# Patient Record
Sex: Male | Born: 1980 | Race: Black or African American | Hispanic: No | Marital: Single
Health system: Northeastern US, Academic
[De-identification: ages and names within clinical notes are randomized; demographics above are authoritative.]

## PROBLEM LIST (undated history)

## (undated) DIAGNOSIS — B20 Human immunodeficiency virus [HIV] disease: Secondary | ICD-10-CM

## (undated) DIAGNOSIS — Z21 Asymptomatic human immunodeficiency virus [HIV] infection status: Secondary | ICD-10-CM

## (undated) HISTORY — PX: HERNIA REPAIR: SHX51

## (undated) MED FILL — DOXYCYCLINE HYCLATE 100 MG TABLET: 100 100 mg | ORAL | 5 days supply | Qty: 10 | Fill #0 | Status: CN

---

## 2015-06-18 ENCOUNTER — Ambulatory Visit

## 2015-06-23 ENCOUNTER — Ambulatory Visit

## 2015-06-23 NOTE — Telephone Encounter (Signed)
PHONE NOTE     CALL INFORMATION:     Person Calling: Tarvares p  Relationship to pt: Pt  Day Phone #: [phone]       CALL DETAILS:   pt seeking blood work results   Call Taken by: ......................................Marland KitchenNancy Nordmann  June 23, 2015 2:34 PM         RESPONSE/ORDERS:  2nd seeking blood test results. States he is concerned due to mother being in ICU in house and wants to make sure everything is alright with him ......................................Marland KitchenRanae Palms  June 23, 2015 3:39 PM    called discussed results  ......................................Marland KitchenNeale Burly, MD  June 23, 2015 7:47 PM               ORDERS/PROBS/MEDS/ALL     Problems:   HIV (ICD-042) (ICD10-B20)  DEPRESSION (ICD-311) (ICD10-F32.9)  URI (ICD-465.8) (ICD10-J06.9)  ROUTINE EXAMINATION (ICD-V70.0) (ICD10-Z00.00)          Created By Nancy Nordmann on 06/23/2015 at 02:34 PM    Electronically Signed By Neale Burly MD on 06/23/2015 at 07:47 PM

## 2015-08-02 ENCOUNTER — Ambulatory Visit: Admitting: Emergency Medicine

## 2015-08-02 ENCOUNTER — Emergency Department: Admit: 2015-08-02 | Disposition: A | Discharge status: Home / Self Care | Source: Home / Self Care

## 2015-08-02 NOTE — ED Provider Notes (Signed)
.  .  Name: [name], [name]  MRN: [id number]  Age: 35 yrs  Sex: Male  DOB: [birth year]  Arrival Date: 08/02/2015  Arrival Time: 14:05  Account#: [id number]  Bed E1  PCP: Danae Chen  Chief Complaint: STD Exposure  .  Presentation:  05/13  14:11 Presenting complaint: Patient states: pain left foot for 3-4    cp        weeks. no known injury. exposed to syphilis and hands have rash        for 3-4 days. swelling near left groin fold above leg.  14:11 Method Of Arrival: Walk In                                      cp  14:11 Acuity: Adult 4                                                 cp  .  Historical:  - Allergies:  14:15 Penicillins;                                                    cp  - Home Meds:  14:15 Stribild oral oral [Active];                                    cp  - PMHx:  14:15 HIV;                                                            cp  - PSHx:  14:15 Hernia repair;                                                  cp  .  - Social history: Smoking status: Patient states was never    smoker of tobacco.  - The history from nurses notes was reviewed: and I agree with    what is documented.  .  .  Screening:  14:15 SEPSIS SCREENING - Temp > 38.3 or < 36.0 No - Heart Rate > 90   cp        No - Respiratory > 20 No - SBP < 90 No SIRS Criteria (> = 2)        No. Safety screen: Patient feels safe. Suicide Screening:        Patients presentation: No risk factors. Nutritional screening:        No deficits noted. Tuberculosis screening: No symptoms or risk        factors identified. Fall Risk None identified. Exposure        Risk/Travel Screening: None identified.  .  Vital Signs:  14:05 BP 110 / 64 Right Arm Sitting (auto/reg);  Pulse 87 Monitor;     at17        Resp 16 Spontaneous; Temp 98.4(O); Pulse Ox 96% on R/A; Weight        70.31 kg (R); Height 5 ft. 9 in. (175.26 cm) (R); Pain 4/10;  16:29 BP 106 / 73; Pulse 78; Pulse Ox 100% on R/A;                    lw1  14:05 Body Mass Index 22.89 (70.31 kg, 175.26  cm)                     at17  .  Triage Assessment:  14:15 General: Appears in no apparent distress, Behavior is           cp  .  Name:[name], [name]  [id number]  [id number]  Page 1 of 3  %%PAGE  .  Name: [name], [name]  MRN: [id number]  Age: 24 yrs  Sex: Male  DOB: [birth year]  Arrival Date: 08/02/2015  Arrival Time: 14:05  Account#: [id number]  Bed E1  PCP: Danae Chen  Chief Complaint: STD Exposure  .        cooperative. Pain: Pain currently is 2 out of 10 on a pain        scale. Neuro: Level of Consciousness is awake, alert.        Respiratory: Respiratory effort is even, unlabored. Skin: left        foot: no swelling, erythema. nontender to palpation over distal        1st metacarpal.  .  Assessment:  15:30 General: as triaged--see P.A. note, Attending note, ID          lw1        concerns---pt is requesting treatment for syphilis --has been        told he had an allergy to Penicillin as a child per his        Mom--unknown reaction--does report that he has had amoxicillin        a few times with no problems.  15:50 General: explained to patient he would need the antibiotic in 2 lw1        shots, or IV--decided to have an injection in each        buttock---initially did not care to wait to be observed for any        bad reaction despite suggestion from MD--states his sister        lives nearby and he would come back--but after first        injection--pt was concerned about the pain--held off on the        next injection--.  Marland Kitchen  Observations:  14:05 Patient arrived in ED.                                          at17  14:11 Patient Visited By: Eulah Citizen                              so5  14:11 Registration completed.                                         dl13  14:11 Patient Visited By:  Elray Buba                            dl13  14:13 Triage Completed.                                               cp  14:27 Patient Visited By: Vivien Presto                             ny2  15:19 Patient Visited  By: Loree Fee  .  Dispensed Medications:  16:00 Drug: cefTRIAXone 1 grams Route: IM; Site: left gluteus;        lw1  16:29 Follow up: BP 106 / 73; Pulse 78 bpm; Pulse Ox 100% RA;         lw1        medication given in each gluteal muscle  .  Marland Kitchen  Intake:  .  Interventions:  14:15 Patient placed in exam room.                                    cp  15:16 Demo Sheet Scanned into Chart                                   jd26  .  Outcome:  .  Name:[name], [name]  [id number]  [id number]  Page 2 of 3  %%PAGE  .  Name: [name], [name]  MRN: [id number]  Age: 60 yrs  Sex: Male  DOB: 11-12-1980  Arrival Date: 08/02/2015  Arrival Time: 14:05  Account#: [id number]  Bed E1  PCP: Danae Chen  Chief Complaint: STD Exposure  .  15:50 Discharge ordered by MD.                                        Dyann Kief  16:20 Patient left the ED.                                            ny2  16:20 Discharged to home ambulatory. Condition: stable. Discharge     lw1        instructions given to patient, Instructed on discharge        instructions, follow up and referral plans. --has been observed        for almost 30 minutes--still spoke with patient about any and        all reasons to return Demonstrated understanding of        instructions. Discharge Assessment: Patient awake, alert and        oriented x 3. No cognitive and/or functional deficits noted.        Patient verbalized understanding of disposition instructions.        Chart Status Nursing note complete  and electronically signed.  .  Signatures:  Winning, Linda                          RN   lw1  Barbie Banner                          RN   cp  Vivien Presto                         MD   ny2  Fairland, Sherman                          [place]   so5  daSilva, Jessica                             jd26  Elray Buba                             dl13  Tsomides, Bethann Goo  .  .  .  .  .  .  .  .  .  .  .  .  .  .  .  .  .  .  .  .  .  .  .  Name:[name], [name]  [id number]  [id number]  Page 3 of 3  .  %%END

## 2015-08-02 NOTE — ED Provider Notes (Signed)
Marland Kitchen  Name: [name], [name]  MRN: [id number]  Age: 35 yrs  Sex: Male  DOB: 09/15/1980  Arrival Date: 08/02/2015  Arrival Time: 14:05  Account#: [id number]  .  Working Diagnosis: Syphilis, unspecified  PCP: Danae Chen  .  HPI:  05/13  14:13 35 y/o male with hx of HIV who presents with 3 - 4 weeks of     so5        atraumatic foot pain which is affecting his gait, left sided        upper leg abscess and concern about exposure to syphilis. He        states that he was treated for syphilis and finished        antibiotics 2 months ago. He states that at that time he had        some erythema of his palms but was never diagnosed with        syphilis. He was initially treated because of exposure to        syphilis. He recently found out that his partner was diagnosed        with syphilis for a second time and he has noticed some new        redness of his palms. In terms of the abscess he states that it        looks like a pimple that has been getting larger for several        days and it is slightly painful His HIV viral loads are        undetectable. He denies fevers/chills, abdominal pain,        nausea/vomiting, dysuria or penile discharge. .  .  Historical:  - Allergies: Penicillins;  - Home Meds: Stribild oral oral;  - PMHx: HIV;  - PSHx: Hernia repair;  - Social history: Smoking status: Patient states was never    smoker of tobacco.  - The history from nurses notes was reviewed: and I agree with    what is documented.  .  .  Vital Signs:  14:05 BP 110 / 64 Right Arm Sitting (auto/reg); Pulse 87 Monitor;     at17        Resp 16 Spontaneous; Temp 98.4(O); Pulse Ox 96% on R/A; Weight        70.31 kg (R); Height 5 ft. 9 in. (175.26 cm) (R); Pain 4/10;  16:29 BP 106 / 73; Pulse 78; Pulse Ox 100% on R/A;                    lw1  14:05 Body Mass Index 22.89 (70.31 kg, 175.26 cm)                     at17  .  MDM:  .  Dispensed Medications:  16:00 Drug: cefTRIAXone 1 grams Route: IM; Site: left gluteus;        lw1  16:29 Follow  up: BP 106 / 73; Pulse 78 bpm; Pulse Ox 100% RA;         lw1        medication given in each gluteal muscle  .  Marland Kitchen  Name:[name], [name]  [id number]  [id number]  Page 1 of 3  %%PAGE  .  Name: [name], [name]  MRN: [id number]  Age: 51 yrs  Sex: Male  DOB: 1980/05/20  Arrival Date: 08/02/2015  Arrival Time: 14:05  Account#: [id number]  .  Working Diagnosis: Syphilis, unspecified  PCP: Renaldo Reel,  Kristen  .  Marland Kitchen  Attending Notes:  15:27 Attending HPI: 35 yo male hx of HIV states sexual partner       ny2        tested positive for syphilus 3 wks ago called him 1.5 wks ago.        he was out of town at the time and returned to [place] a few        days ago now leaving again to [place] and would like        to be evaluated re. possible treatment. c/o itchy rash to both        palms x 3-4 days and L foot pain for > 3 wks . pcp is here but        has not called him or seen him lately. not currently followed        by ID . does not want testing for any other std's no other c/o        . Attending ROS Constitutional: no fever / chills        Cardiovascular: denies CP / palpitations Respiratory: no sob;        no cough Abdomen/GI: no abdominal pain / nausea / vomiting        /diarrhea GU: no dysuria or difficulty urinating GU: Negative        for foul smelling urine, penile discharge. Attending Exam:        Constitutional: awake alert Head/Face: NCAT; voice normal        Cardiovascular: RR +S1 +S2 ; no pulse deficits Respiratory: the        patient does not display signs of respiratory distress, Breath        sounds: are normal, no acute changes, Abdomen/GI: Palpation:        abdomen is soft and non-tender, Neuro: Mentation: is normal,        Motor: strength is normal, Skin: rash, mild erythema to lateral        aspects of both palms , Turgor: is excellent, MS/Extremity:        ROM: intact in all extremities, Circulation: circulation is        intact in all extremities, Psych: Behavior/mood is cooperative.        Reviewed  Nurses Notes.  15:51 ED Course: discussed w/ infectious disease who did recommend    ny2        ceftriaxone IM pending results. recommend f/u in the ID clinic        as soon as possible. also recommend f/u pcp. My working        Impression: exposure to syphilis / possible syphilis /        dermatitis. Attending chart complete and electronically signed:        Olean Ree; MD.  15:54 Attestation: Assessment and care plan reviewed with             ny2        resident/midlevel provider. See their note for details.        Physician Assistant's history reviewed, patient interviewed and        examined. Attending chart complete and electronically signed:        Olean Ree; MD.  .  Disposition Summary:  16:20 08/02/2015 15:50 Discharged to Home. Impression: Syphilis,      ny2        unspecified. Condition is Stable. Forms are Medication  Reconciliation Form. Follow up: [facility]; When:        Next week; Reason: Continuance of care. Problem is new.  .  Name:[name], [name]  [id number]  [id number]  Page 2 of 3  %%PAGE  .  Name: [name], [name]  MRN: [id number]  Age: 39 yrs  Sex: Male  DOB: 1980-05-06  Arrival Date: 08/02/2015  Arrival Time: 14:05  Account#: [id number]  .  Working Diagnosis: Syphilis, unspecified  PCP: Danae Chen  .        Symptoms are unchanged.  .  Signatures:  Winning, Linda                          RN   lw1  Barbie Banner                          RN   cp  Vivien Presto                         MD   ny2  Eulah Citizen                          [place]   so5  Elray Buba                             dl13  .  Corrections: (The following items were deleted from the chart)  15:32 15:27 Attending HPI: 35 yo male hx of HIV states sexual partner ny2        tested positive for syphilus 3 wks ago called him 1.5 wks ago.        he was out of town at the time and returned to [place] a few        days ago now leaving again to [employer] and would like        to be evaluated re. possible  treatment. c/o itchy rash to both        palms x 3-4 days and L foot pain for > 3 wks , ny2  15:54 15:27 Attending Exam: Constitutional: awake alert Head/Face:    ny2        NCAT; voice normal Skin: rash, mild erythema to lateral aspects        of both palms , Turgor: is excellent, MS/Extremity: ROM: intact        in all extremities, Circulation: circulation is intact in all        extremities, ny2  15:54 15:27 Attending HPI: 35 yo male hx of HIV states sexual partner ny2        tested positive for syphilus 3 wks ago called him 1.5 wks ago.        he was out of town at the time and returned to [place] a few        days ago now leaving again to [employer] and would like        to be evaluated re. possible treatment. c/o itchy rash to both        palms x 3-4 days and L foot pain for > 3 wks . pcp is here but        has not called him or seen him lately. not currently followed        by ID , ny2  .  Document is preliminary until electronically or manually signed by the atte  nding physician  .  .  .  .  .  .  .  .  .  .  .  .  Name:[name], [name]  [id number]  [id number]  Page 3 of 3  .  %%END

## 2015-08-05 LAB — HX IMMUNOLOGY: HX RPR: NONREACTIVE

## 2015-08-06 ENCOUNTER — Ambulatory Visit

## 2015-08-06 ENCOUNTER — Ambulatory Visit (HOSPITAL_BASED_OUTPATIENT_CLINIC_OR_DEPARTMENT_OTHER): Admitting: Psychiatry

## 2015-08-11 ENCOUNTER — Ambulatory Visit

## 2015-08-11 NOTE — Telephone Encounter (Signed)
PHONE NOTE     CALL INFORMATION:   Reason for call: Other  Person Calling: Tayron  Relationship to pt: Pt  PCP: Para March  Time/Date of call: 08/11/15 2:54pm  Day Phone #: [phone]      CALL DETAILS:   Hi, patient is requesting a call back from a nurse to obtain lab results. Patient can be reached at [phone].    Thanks  Call Taken by: ......................................Marland KitchenSophia Cobourne  Aug 11, 2015 2:55 PM          RESPONSE/ORDERS:    syphilis is neg- let patient know  ......................................Marland KitchenNeale Burly, MD  Aug 11, 2015 4:07 PM                 ORDERS/PROBS/MEDS/ALL     Problems:   ANXIETY (ICD-300.00) (ICD10-F41.9)  HEMORRHOIDS (ICD-455.6) (PIR51-O84.1)  HIV (ICD-042) (ICD10-B20)  DEPRESSION (ICD-311) (ICD10-F32.9)  URI (ICD-465.8) (ICD10-J06.9)  ROUTINE EXAMINATION (ICD-V70.0) (ICD10-Z00.00)    Meds (prior to this call):   * STIBILD 1 daily          Created By Anson [place] on 08/11/2015 at 02:54 PM    Electronically Signed By Neale Burly MD on 08/11/2015 at 04:07 PM

## 2016-12-25 ENCOUNTER — Emergency Department: Payer: Self-pay

## 2016-12-25 ENCOUNTER — Emergency Department
Admission: EM | Admit: 2016-12-25 | Discharge: 2016-12-25 | Disposition: A | Discharge status: Home / Self Care | Payer: Self-pay | Attending: Emergency Medicine | Admitting: Emergency Medicine

## 2016-12-25 ENCOUNTER — Encounter: Payer: Self-pay | Admitting: Emergency Medicine

## 2016-12-25 DIAGNOSIS — Z79899 Other long term (current) drug therapy: Secondary | ICD-10-CM | POA: Insufficient documentation

## 2016-12-25 DIAGNOSIS — J069 Acute upper respiratory infection, unspecified: Secondary | ICD-10-CM | POA: Insufficient documentation

## 2016-12-25 DIAGNOSIS — B20 Human immunodeficiency virus [HIV] disease: Secondary | ICD-10-CM | POA: Insufficient documentation

## 2016-12-25 DIAGNOSIS — R079 Chest pain, unspecified: Secondary | ICD-10-CM | POA: Insufficient documentation

## 2016-12-25 HISTORY — DX: Asymptomatic human immunodeficiency virus (hiv) infection status: Z21

## 2016-12-25 HISTORY — DX: Human immunodeficiency virus (HIV) disease: B20

## 2016-12-25 LAB — CBC
HCT: 36.7 % — ABNORMAL LOW (ref 40.0–52.0)
Hemoglobin: 12.2 g/dL — ABNORMAL LOW (ref 13.0–18.0)
MCH: 29.5 pg (ref 26.0–34.0)
MCHC: 33.2 g/dL (ref 32.0–36.0)
MCV: 89 fL (ref 80.0–100.0)
PLATELETS: 248 10*3/uL (ref 150–440)
RBC: 4.12 MIL/uL — ABNORMAL LOW (ref 4.40–5.90)
RDW: 13.3 % (ref 11.5–14.5)
WBC: 5.9 10*3/uL (ref 3.8–10.6)

## 2016-12-25 LAB — URINALYSIS, COMPLETE (UACMP) WITH MICROSCOPIC
BACTERIA UA: NONE SEEN
Bilirubin Urine: NEGATIVE
GLUCOSE, UA: NEGATIVE mg/dL
Hgb urine dipstick: NEGATIVE
Ketones, ur: NEGATIVE mg/dL
Leukocytes, UA: NEGATIVE
Nitrite: NEGATIVE
PH: 6 (ref 5.0–8.0)
PROTEIN: NEGATIVE mg/dL
SQUAMOUS EPITHELIAL / LPF: NONE SEEN
Specific Gravity, Urine: 1.002 — ABNORMAL LOW (ref 1.005–1.030)

## 2016-12-25 LAB — COMPREHENSIVE METABOLIC PANEL
ALBUMIN: 4.6 g/dL (ref 3.5–5.0)
ALT: 31 U/L (ref 17–63)
AST: 32 U/L (ref 15–41)
Alkaline Phosphatase: 51 U/L (ref 38–126)
Anion gap: 8 (ref 5–15)
BUN: 18 mg/dL (ref 6–20)
CHLORIDE: 105 mmol/L (ref 101–111)
CO2: 22 mmol/L (ref 22–32)
CREATININE: 1.23 mg/dL (ref 0.61–1.24)
Calcium: 9.3 mg/dL (ref 8.9–10.3)
GFR calc Af Amer: >60 mL/min (ref >60)
GFR calc non Af Amer: >60 mL/min (ref >60)
Glucose, Bld: 122 mg/dL — ABNORMAL HIGH (ref 65–99)
Potassium: 3.6 mmol/L (ref 3.5–5.1)
SODIUM: 135 mmol/L (ref 135–145)
Total Bilirubin: 0.6 mg/dL (ref 0.3–1.2)
Total Protein: 7.8 g/dL (ref 6.5–8.1)

## 2016-12-25 LAB — LIPASE, BLOOD: LIPASE: 18 U/L (ref 11–51)

## 2016-12-25 LAB — TROPONIN I: Troponin I: <0.03 ng/mL (ref <0.03)

## 2016-12-25 MED ORDER — GUAIFENESIN-CODEINE 100-10 MG/5ML PO SOLN: 5.0000 mL | Freq: Four times a day (QID) | ORAL | 0 refills | Status: AC | PRN

## 2016-12-25 NOTE — ED Notes (Signed)
Patient c/o left chest pain radiating to back. Patient reports concurrent symptoms of SOB, nausea, dizziness, lightheadedness and weakness. Pt reports he was diagnosed with URI and started antibiotics yesterday. Pt reports he was also started on steroids yesterday for SOB (has hx of asthma.

## 2016-12-25 NOTE — ED Provider Notes (Signed)
[facility] Emergency Department Provider Note  Time seen: 5:08 AM  I have reviewed the triage vital signs and the nursing notes.   HISTORY  Chief Complaint Chest Pain and Abdominal Pain    HPI Daniel Escobar is a 36 y.o. male With a past medical history of HIV who presents to the emergency department for chest pain and cough. According to the patient for the past one week he has had a cough with mild shortness of breath and occasional chest discomfort. Patient was seen by his infectious disease doctor yesterday who did blood work but the results were not returned yet. Was prescribed Zithromax and prednisone. Patient states he started taking these medications yesterday he continued to have chest discomfort and cough today so he came to the emergency department for evaluation. Denies any fever at any point. Denies any vomiting. Patient states he has left lower chest pain, mostly when coughing.  Past Medical History:  Diagnosis Date  . HIV (human immunodeficiency virus infection) (HCC)     There are no active problems to display for this patient.   Past Surgical History:  Procedure Laterality Date  . HERNIA REPAIR      Prior to Admission medications   Medication Sig Start Date End Date Taking? Authorizing Provider  azithromycin (ZITHROMAX) 250 MG tablet Take 250 mg by mouth daily.    Yes [provider]  naproxen (NAPROSYN) 500 MG tablet Take 500 mg by mouth 2 (two) times daily as needed.   Yes [provider]    Allergies  Allergen Reactions  . Penicillins Other (See Comments)    Unknown, just knows he can't take it    History reviewed. No pertinent family history.  Social History Social History  Substance Use Topics  . Smoking status: Never Smoker  . Smokeless tobacco: Never Used  . Alcohol use Yes    Review of Systems Constitutional: Negative for fever. Cardiovascular: positive for lower left chest pain Respiratory:  Negative for shortness of breath.positive for cough with occasional yellow sputum production. Gastrointestinal: Negative for abdominal pain, vomiting. Patient does state loose stool for the past 24 hours. Neurological: Negative for headache All other ROS negative  ____________________________________________   PHYSICAL EXAM:  VITAL SIGNS: ED Triage Vitals  Enc Vitals Group     BP 12/25/16 0345 116/71     Pulse Rate 12/25/16 0345 87     Resp 12/25/16 0345 17     Temp 12/25/16 0345 98.1 F (36.7 C)     Temp Source 12/25/16 0345 Oral     SpO2 12/25/16 0345 97 %     Weight 12/25/16 0345 155 lb (70.3 kg)     Height 12/25/16 0345  (1.753 m)     Head Circumference --      Peak Flow --      Pain Score 12/25/16 0343 6     Pain Loc --      Pain Edu? --      Excl. in GC? --     Constitutional: Alert and oriented. Well appearing and in no distress. Eyes: Normal exam ENT   Head: Normocephalic and atraumatic.   Mouth/Throat: Mucous membranes are moist. Cardiovascular: Normal rate, regular rhythm. No murmur Respiratory: Normal respiratory effort without tachypnea nor retractions. Breath sounds are clea Gastrointestinal: Soft and nontender. No distention.  Musculoskeletal: Nontender with normal range of motion in all extremities. [facility]. No gross focal neurologic deficits Skin:  Skin is warm,  dry and intact.  Psychiatric: Mood and affect are normal.  ____________________________________________    EKG  EKG reviewed and interpreted by myself shows normal sinus rhythm at 89 bpm, narrow QRS, normal axis, normal intervals, no concerning ST changes.  ____________________________________________    RADIOLOGY  chest x-ray negative  ____________________________________________   INITIAL IMPRESSION / ASSESSMENT AND PLAN / ED COURSE  Pertinent labs & imaging results that were available during my care of the patient were reviewed by me  and considered in my medical decision making (see chart for details).  patient presents the emergency department form week of intermittent chest pain, cough and shortness of breath at times. Differential this time would include pneumonia, ACS, pneumothorax, bronchitis, upper respiratory infection. Overall the patient appears extremely well, no distress, normal physical exam. Patient's EKG and chest x-ray are reassuring. Patient's labs are normal including a negative troponin. I discussed the results with the patient and need to continue to take the prednisone and antibiotic and follow-up with his doctor for recheck. In addition to these medications we will also prescribe a codeine based cough medication for the patient for symptomatic relief. I discussed with patient he cannot drink alcohol or drive while taking this medication, he understands this.  ____________________________________________   FINAL CLINICAL IMPRESSION(S) / ED DIAGNOSES  chest pain Cough    Minna Antis, MD 12/25/16 [phone]

## 2016-12-25 NOTE — ED Notes (Signed)
Reviewed d/c instructions, follow-up care, prescription with patient. Pt verbalized understanding.  

## 2016-12-25 NOTE — ED Triage Notes (Addendum)
Pt reports started on Zithromax yesterday for upper resp infection, prescribed by his MD in [place]; pt is traveling from [place] to [place] and has stopped here for c/o left upper quadrant abd pain and left chest pain for over 1 week; pt adds since yesterday evening he began having loose green stools, has had 5 since they started; pt was prescribed Naproxen for pain, has taken one with no relief

## 2018-10-03 LAB — HEP C AB WITH REFLEX QUANT (EXT): HEPATITIS C ANTIBODY (EXT): NONREACTIVE

## 2018-10-04 LAB — CHLAMYDIA/GC (EXT)
Chlamydia trachomatis, Urine (EXT): NEGATIVE
Neisseria Gonorrhoeae, Urine (EXT): NEGATIVE

## 2018-10-05 LAB — CHLAMYDIA/GC (EXT)
Chlamydia Trachomatis,Rectal (EXT): NOT DETECTED
Chlamydia trachomatis, Throat (EXT): NOT DETECTED
Neisseria gonorrhoeae, Rectal (EXT): NOT DETECTED
Neisseria gonorrhoeae, Throat (EXT): NOT DETECTED

## 2018-10-16 IMAGING — CR DG CHEST 2V
2 series · 2 of 2 positions shown · non-contrast
Comparison: None.

CLINICAL DATA: Left-sided chest pain. Patient began antibiotics
yesterday for upper respiratory tract infection. Loose stools today.

EXAM:
CHEST  2 VIEW

[chest pa]
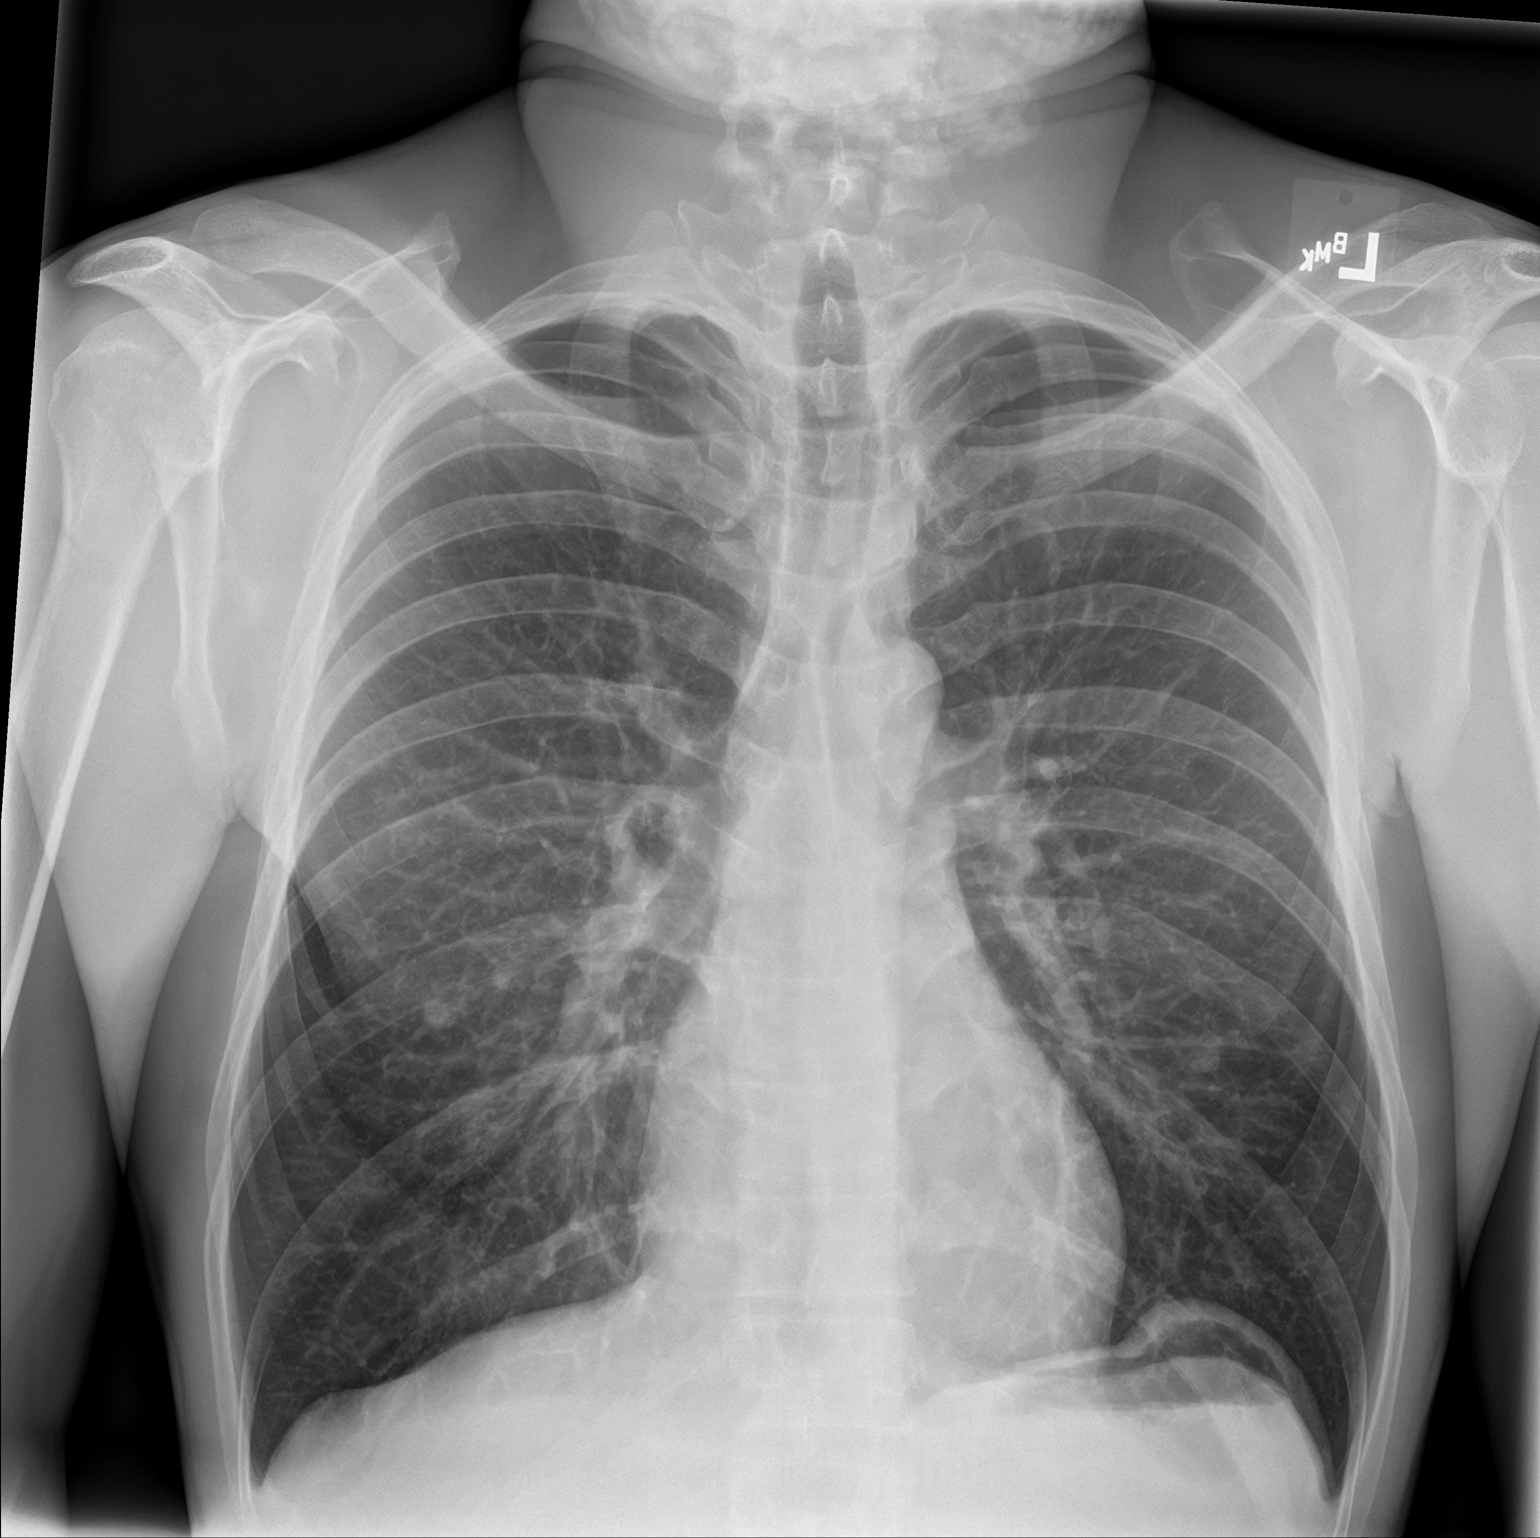

[chest lat]
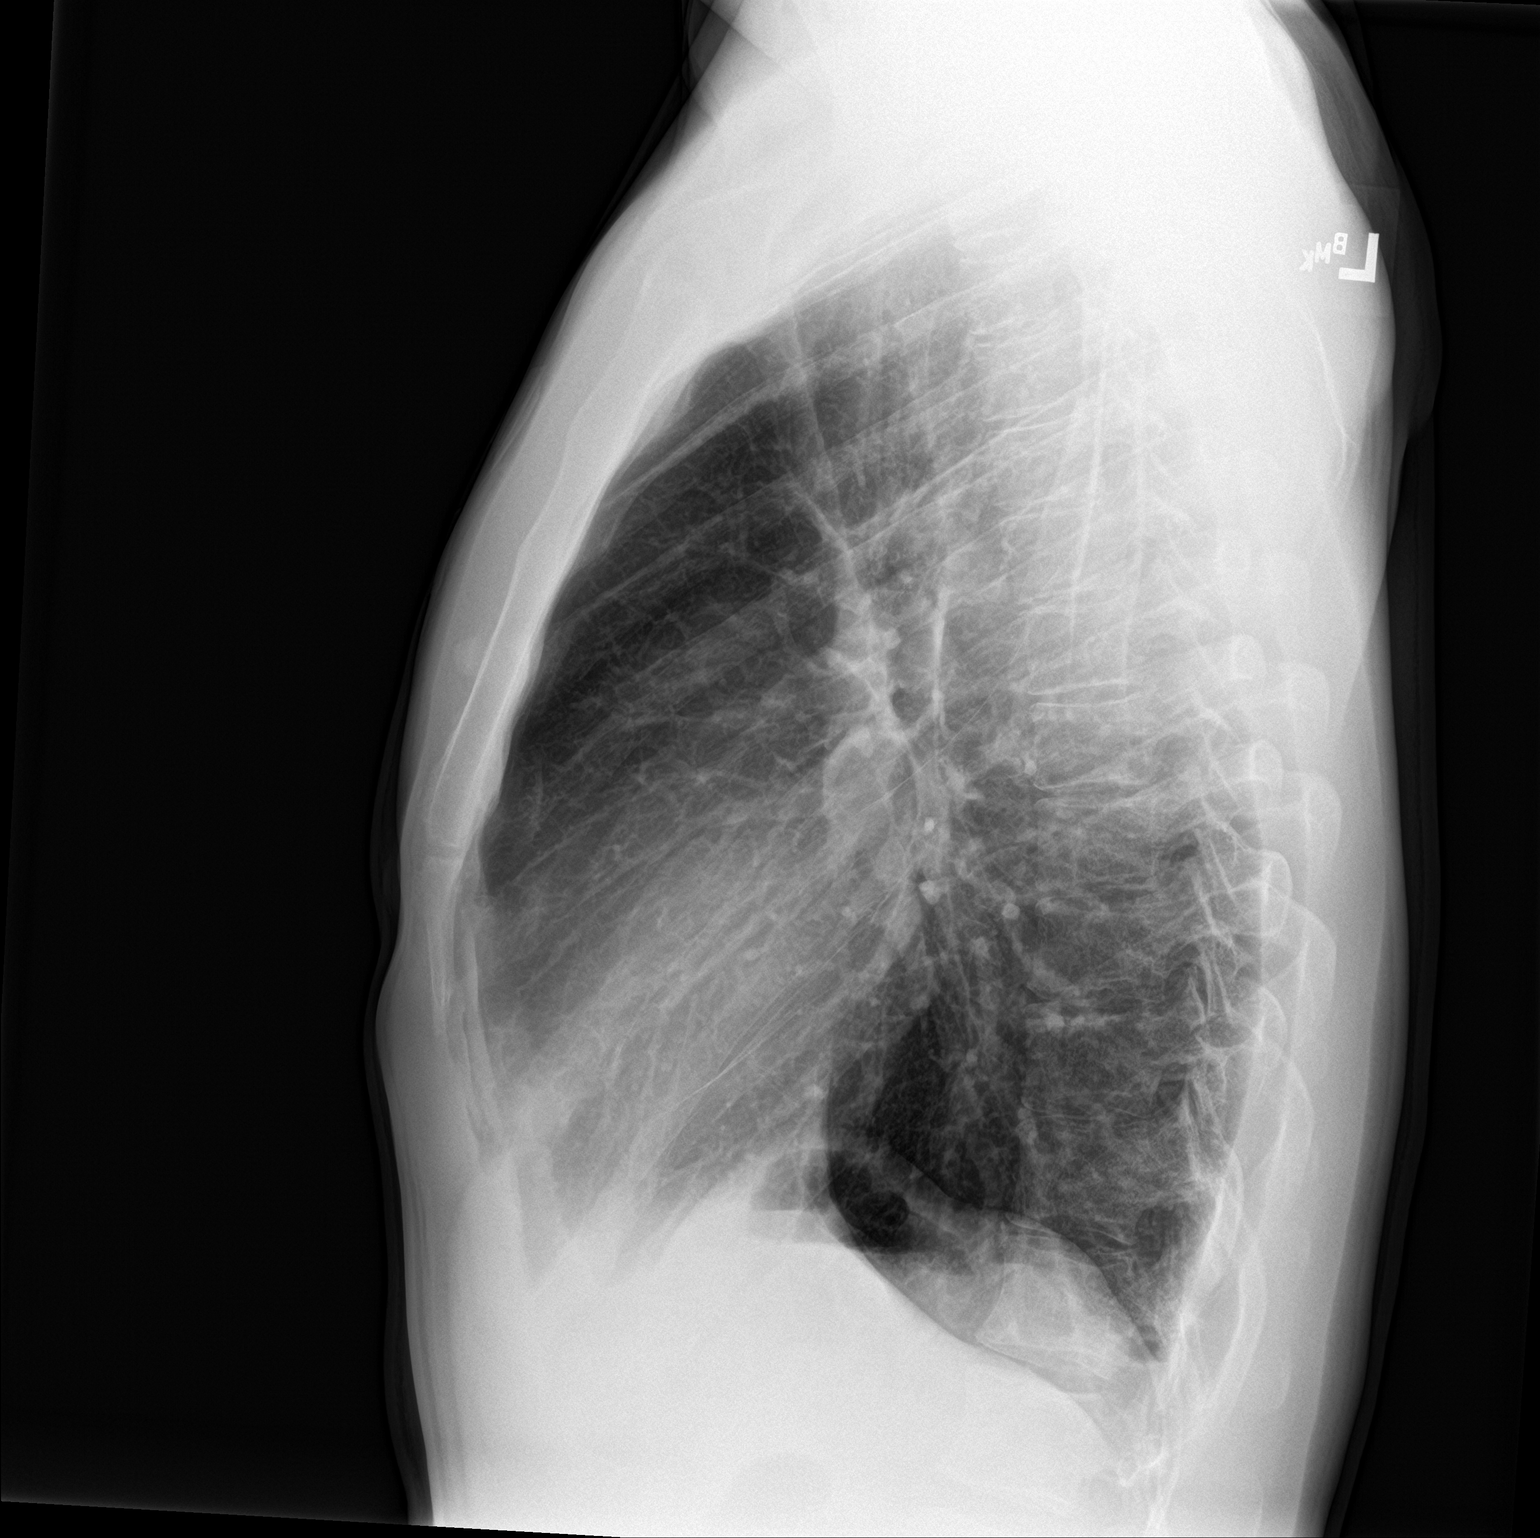

[2 of 2 positions shown; findings below may reference images not displayed]

FINDINGS: Pulmonary hyperinflation. Normal heart size and pulmonary
vascularity. No focal airspace disease or consolidation in the
lungs. No blunting of costophrenic angles. No pneumothorax.
Mediastinal contours appear intact. Prominent nipple shadows
projected over the mid lungs.
IMPRESSION: No evidence of active pulmonary disease.  Mild hyperinflation.

## 2019-01-18 ENCOUNTER — Emergency Department
Admit: 2019-01-18 | Discharge: 2019-01-18 | Disposition: A | Discharge status: Home / Self Care | Payer: No Typology Code available for payment source

## 2019-01-18 ENCOUNTER — Emergency Department
Admit: 2019-01-18 | Disposition: A | Discharge status: Home / Self Care | Source: Home / Self Care | Attending: Emergency Medicine | Admitting: Emergency Medicine

## 2019-01-18 ENCOUNTER — Ambulatory Visit

## 2019-01-18 ENCOUNTER — Ambulatory Visit: Admitting: Emergency Medicine

## 2019-01-18 LAB — HX MICRO-RESP VIRAL PANEL: HX COVID-19 (SARS-COV-2): NEGATIVE

## 2019-01-18 NOTE — ED Provider Notes (Signed)
 Marland Kitchen  Name: [name], Helt  MRN: [id number]  Age: 38 yrs  Sex: Male  DOB: 01-May-1980  Arrival Date: 01/18/2019  Arrival Time: 11:56  Account#: [id number]  .  Working Diagnosis:  - URI, unspecified  PCP: Prescilla Sours  .  HPI:  10/23  72:63 38 year old male with a PMH of HIV and anxiety presents to the  jd26        ED with URI symptoms and cough x2 weeks. Patient states he has        multiple family members who have had similar symptoms and        reports that he has not had a COVID test. He states that about        a week ago he did a telemedicine visit with his primary care        office who prescribed him a Z-Pak and an inhaler. He states he        did be entirely Z-Pak with minimal relief of symptoms. Patient        states that he has had postnasal drip intermittently for the        last 5 years. He states that he is currently having postnasal        drip which is causing him to have occasional coughing fits.        Patient states that when he is coughing he has intermittent        chest wall pain.Patient states that he takes antiviral        medication daily and he last had viral load checked 4 months        ago at his primary care. Patient reports that he smokes        marijuana daily. Patient denies any fevers or chills, SOB,        abdominal pain, or nausea/vomiting/diarrhea. .  .  Historical:  - Allergies: Penicillins;  - Home Meds: trimex; zyrtec; Flonase Nasal;  - PMHx: HIV; panic attacks;  - Social history: Smoking status: The patient is not a current    smoker. Patient uses Marijuana Patient/guardian denies using    alcohol, street drugs, IV drugs.  - The history from nurses notes was reviewed: and I agree with    what is documented.  .  .  ROS:  13:19 Constitutional: Negative for body aches, chills, fatigue, fever.jd26  13:19 Eyes: Negative for blurry vision, visual disturbance.  13:19 ENT: Positive for rhinorrhea, sinus congestion, Negative for        sore throat.  13:19 Cardiovascular: Positive for  pleuritic chest pain, Negative for        edema, palpitations.  13:19 Respiratory: Positive for cough, Negative for shortness of        breath.  13:19 Abdomen/GI: Negative for abdominal pain, nausea, vomiting,        diarrhea, constipation.  13:19 GU: Negative for urinary symptoms.  .  Name:Boakye, Rashawd  EXB:[id number]  [id number]  Page 1 of 4  %%PAGE  .  Name: [name], Elsayed  MRN: [id number]  Age: 37 yrs  Sex: Male  DOB: 1980-09-25  Arrival Date: 01/18/2019  Arrival Time: 11:56  Account#: [id number]  .  Working Diagnosis:  - URI, unspecified  PCP: Prescilla Sours  .  13:19 Neuro: Negative for altered mental status, dizziness, headache,        Lightheadedness numbness, weakness.  13:19 Psych: Positive for drug dependence, Negative for alcohol        dependence.  13:19 Allergy/Immunology: Positive for allergies.  .  Vital Signs:  12:04 BP 128 / 80; Pulse 99; Resp 22; Temp 36.5; Pulse Ox 95% ; Pain  kb30        5/10;  14:28 BP 110 / 60; Pulse 90; Resp 20; Temp 37.1(O); Pulse Ox 95% on   km31        R/A;  .  Glasgow Coma Score:  12:04 Eye Response: spontaneous(4). Verbal Response: oriented(5).     kb30        Motor Response: obeys commands(6). Total: 15.  .  Exam:  13:20 Constitutional: The patient appears alert, well developed,      jd26        awake, anxious, comfortable, and in no acute distress  13:20 Head/face: Normocephalic, atraumatic.  13:20 Eyes: PERRL.  13:20 ENT: Moist mucous membranes, mild erythema present in the        posterior oropharynx.  13:20 Respiratory: CTAB, nonlabored breathing, symmetrical chest        expansion.  13:29 Cardiovascular: Regular rate and rhythm, no peripheral edema.   jd26  13:29 Abdomen/GI: Soft, nontender. Normoactive bowel sounds in all        quadrants. No distention masses or guarding.  13:29 Neuro: GCS 15, AOX4, cranial nerves II through XII grossly      jd26        intact, moving all 4 extremities, steady gait.  13:29 Psych: Behavior/mood is pleasant, cooperative, anxious,  Affect        is calm.  13:29 Skin: Warm dry and intact.  .  MDM:  14:14 ED course: 38 year old male with a PMH of HIV and anxiety       jd26        presented to the ED with URI symptoms and cough x2 weeks. EKG        showed normal sinus rhythm and no ST changes. Chest x-ray        revealed no acute or focal consolidation. The patient has been        afebrile. COVID test sent and results pending. Informed patient        that he will not receive his Covid test results until tomorrow.        Advised the patient to quaratine until he receives his results.  .  Name:Filosa, [name]  [id number]:[id number]  [phone]  Page 2 of 4  %%PAGE  .  Name: Markell, [name]  MRN: [id number]  Age: 89 yrs  Sex: Male  DOB: 06-Aug-1980  Arrival Date: 01/18/2019  Arrival Time: 11:56  Account#: [id number]  .  Working Diagnosis:  - URI, unspecified  PCP: Prescilla Sours  .        Sent the patient home with prescriptions for guaifenesin and        Tessalon Perles. The patient has a scheduled appointment with        his PCP on November 11. Patient understands and agrees with        discharge plan. Upon discharge, the patient is stable and in no        acute distress. Patient given instructions to return to the        emergency department if symptoms worsen or new symptoms arise..        Data reviewed: EKG, nurses notes, radiologic studies, vital        signs. Counseling: I had a detailed discussion with the patient        and/or guardian regarding: the  historical points, exam        findings, and any diagnostic results supporting the discharge        diagnosis, need for followup, to return to the emergency        department if symptoms worsen or persist or if there are any        questions or concerns that arise at home. My portion of the ED        chart is complete / electronically signed: Grayling Congress,        PA-C.  Marland Kitchen  10/29  12:33 Order name: COVID-19 PCR (Symptomatic patients)                 jd26  10/29  12:33 Order name: CXR (Portable)                                       jd26  10/29  12:33 Order name: Adult EKG (order using folder); Complete Time: 12:33jd26  10/29  12:33 Order name: EKG (order using folder); Complete Time: 13:18      jd26  .  Radiology Orders:  Order Name: CXR (Portable); Last Status: Returned; Time:    01/18/19 12:33; By: jd26; For: jd26; Order Method:    Electronic; Notes: Bed Name: A5  Disposition Summary:  01/18/19 14:10  Discharge Ordered        Location: Home -                                                jd26        Problem: new                                                    jd26        Symptoms: have improved                                         jd26        Condition: Stable                                               jd26        Diagnosis          - URI, unspecified                                            jd26        Followup:                                                       jd26          -  With: Private Physician          - When: 7 - 10 days          - Reason: Continuance of care  .  Name:Hedstrom, Caidan  [id number]  [id number]  Page 3 of 4  %%PAGE  .  Name: Caileb, [name]  MRN: [id number]  Age: 64 yrs  Sex: Male  DOB: 1981-02-04  Arrival Date: 01/18/2019  Arrival Time: 11:56  Account#: [id number]  .  Working Diagnosis:  - URI, unspecified  PCP: Prescilla Sours  .        Discharge Instructions:          - Discharge Summary Sheet                                     jd26          - URI, Viral, No Abx (Adult)                                  jd26          - COVID-19 Suspected (Pending Testing)                        jd26        Forms:          - Medication Reconciliation Form                              jd26        Prescriptions:          - guaifenesin 400 mg Oral tablet              - take 1 tablet by ORAL route every 4 hours as needed; 20 jd26        tablet; Refills: 0, Product Selection Permitted          - Tessalon Perles 100 mg Oral Capsule              - take 1 capsule by ORAL route every 8 hours as  needed;   jd26        15 capsule; Refills: 0, Product Selection Permitted  Signatures:  Dispatcher, [employer]                          dispa  [place], Kim                              Reg  Armida Sans                     MD   abc  Grayling Congress                        PA   jd26  Eula Listen                          kb30  .  Corrections: (The following items were deleted from the chart)  13:40 30:61 38 year old male with a PMH of HIV and anxiety presents   jd26        to  the ED with URI symptoms and cough x2 weeks. Patient states        he has multiple family members who have had similar symptoms        and reports that he has not had a COVID test. He states that        about a week ago he did a telemedicine visit with his primary        care office who prescribed him a Z-Pak and an inhaler. He        states he did be entirely Z-Pak with minimal relief of        symptoms. Patient states that he has had postnasal drip        intermittently for the last 5 years. He states that he is        currently having postnasal drip which is causing him to have        occasional coughing fits. Patient states that when he is        coughing he has intermittent chest wall pain. Patient denies        any fevers or chills, SOB, abdominal pain, or        nausea/vomiting/diarrhea. Patient states that he takes        antiviral medication daily and he last had viral load checked 4        months ago at his primary care.. jd26  13:30 13:20 Respiratory: CTAB, nonlabored breathing, symmetrical      jd26        chest expansion, jd26  .  Document is preliminary until electronically or manually signed by the atte  nding physician  .  Marland Kitchen  Name:Sheeler, Lucia  [id number]  [phone]  Page 4 of 4  .  %%END

## 2019-01-18 NOTE — ED Provider Notes (Signed)
 .  .  Name: [name], [name]  MRN: [id number]  Age: 38 yrs  Sex: Male  DOB: 05/17/1980  Arrival Date: 01/18/2019  Arrival Time: 11:56  Account#: [id number]  Bed A5  PCP: Prescilla Sours  Chief Complaint: Cough, SOB  .  Presentation:  10/29  12:00 Presenting complaint: Patient states: Patient presents to       kb30        triage with cough occasionally productive small amounts of        sputum, +pain to chest with cough, +SOB/DOE, denies        fever/chills, Patient is +HIV, +ill contacts family was ill        couple of weeks ago with cough/cold symptoms, patient has not        been covid tested, family not covid tested. Patient admits to        anxiety also. Saw PCP 1 week ago, completed Zpack, taking        alleregy meds.  12:00 Method Of Arrival: Walk In                                      kb30  12:00 Acuity: Adult 2                                                 kb30  12:00 Presenting complaint: SIRS criteria.                            kb30  .  Historical:  - Allergies:  12:04 Penicillins;                                                    kb30  - Home Meds:  12:04 trimex [Active]; zyrtec [Active]; Flonase Nasal [Active];       kb30  - PMHx:  12:04 HIV; panic attacks;                                             kb30  .  - Social history: Smoking status: The patient is not a current    smoker. Patient uses Marijuana Patient/guardian denies using    alcohol, street drugs, IV drugs.  - The history from nurses notes was reviewed: and I agree with    what is documented.  .  .  Screening:  12:08 SEPSIS SCREENING - Heart Rate > 90 Yes - Respiratory > 20 Yes   kb30        SIRS Criteria (> = 2) Name of MD/PA notified: ED charge PA        Hashmi. Safety screen: Patient feels safe. Suicide (ED Safe)        Screening: In the past two weeks have you felt down, depressed        or hopelessquestion No. Suicidal Thoughts: Over the past two weeks        patient DENIES thoughts of killing self. Fall Risk None  identified.  Exposure Risk/Travel Screening: Any travel in last        14 daysquestion No. Fever or Respiratory Symptomsquestion Yes. SOB cough        Known COVID 19 exposurequestion Yes. ill contacts with SOB cough        unknown covid Have you Had COVID Testingquestion No.  .  Vital Signs:  .  Name:[name], [name]  [id number]  [id number]  Page 1 of 3  %%PAGE  .  Name: [name], [name]  MRN: [id number]  Age: 77 yrs  Sex: Male  DOB: July 07, 1980  Arrival Date: 01/18/2019  Arrival Time: 11:56  Account#: [id number]  Bed A5  PCP: Prescilla Sours  Chief Complaint: Cough, SOB  .  12:04 BP 128 / 80; Pulse 99; Resp 22; Temp 36.5; Pulse Ox 95% ; Pain  kb30        5/10;  14:28 BP 110 / 60; Pulse 90; Resp 20; Temp 37.1(O); Pulse Ox 95% on   km31        R/A;  .  Glasgow Coma Score:  12:04 Eye Response: spontaneous(4). Verbal Response: oriented(5).     kb30        Motor Response: obeys commands(6). Total: 15.  .  Triage Assessment:  12:04 General: Appears ill, well nourished, well groomed, Behavior is kb30        anxious, cooperative. Pain: Complains of pain in back, right        side, chest Pain currently is 5/10. Neuro: No deficits noted.        Cardiovascular: Capillary refill < 3 seconds Chest pain        intermittent with cough. Respiratory: Airway is patent        Respiratory effort is even, unlabored, Respiratory pattern is        regular, symmetrical, tachypnea. GI: No deficits noted. GU: No        deficits noted. Musculoskeletal: Circulation, motion, and        sensation intact Capillary refill < 3 seconds Range of motion        intact in all extremities.  .  Assessment:  12:53 Reassessment: Assumed care of this Patient following placement  jg8        in A 5. The Patient is awake, alert and oriented. C/O cold        symptoms x 3 weeks. Reports he has been coughing and that with        cough he is having chest pain. Reports the chest pain only        happens when he coughs. Denies fevers, chills or other c/o.        States only has  cold symptoms. COVID swab obtained and sent.  .  Observations:  11:56 Patient arrived in ED.                                          ks  12:03 Triage Completed.                                               kb30  12:12 Registration completed.  ks  12:12 Patient Visited By: Abundio Miu  12:19 Patient Visited By: Grayling Congress                            jd26  12:44 Patient Visited By: Waynard Edwards  .  Procedure:  12:15 EKG done. (by ED staff). No Old EKG Reviewed By: Roslynn Amble MD.  13:31 COVID-19 PCR (Symptomatic patients) Sent.                       jm60  .  Marland Kitchen  Name:[name], [name]  [id number]  [id number]  Page 2 of 3  %%PAGE  .  Name: [name], [name]  MRN: [id number]  Age: 89 yrs  Sex: Male  DOB: 1980/10/14  Arrival Date: 01/18/2019  Arrival Time: 11:56  Account#: [id number]  Bed A5  PCP: Prescilla Sours  Chief Complaint: Cough, SOB  .  Interventions:  12:15 Demo Sheet Scanned into Chart                                   kd14  12:29 ECG/EKG Scanned into Chart                                      vs6  13:36 Outside Patient Records Scanned into Chart                      vs6  .  Outcome:  14:10 Discharge ordered by MD.                                        jd26  14:28 Discharged to home ambulatory. Condition: stable. Discharge     km31        instructions given to patient, Instructed on: discharge        instructions, follow up and referral plans. Demonstrated        understanding of instructions, medications, Prescriptions given        X 2. Discharge Assessment: Patient awake, alert and oriented x        3. No cognitive and/or functional deficits noted. Patient        verbalized understanding of disposition instructions. Chart        Status Nursing note complete and electronically signed.  14:29 Patient left the ED.                                            jg8  .  Corrections:  (The following items were deleted from the chart)  12:04 12:00 Presenting complaint: Patient states: Patient presents to kb30        triage with cough  occasionally productive small amounts of        sputum, +pain to chest with cough, +SOB/DOE, denies        fever/chills, Patient is +HIV, +ill contacts family was ill        couple of weeks ago with cough/cold symptoms, patient has not        been covid tested, family not covid tested. Patient admits to        anxiety also. kb30  12:09 12:00 Acuity: Adult 3 kb30                                      kb30  .  Signatures:  [place], Kim                              Reg  Armida Sans                     MD   abc  Verl Dicker                         RN   [address]                        [place]   jd26  Nelwyn Salisbury                        RN   km31  Felicita Gage                      BSN  [id number]  Eula Listen                          kb30  [place], Erie Noe                               vs6  Kern Reap                          CCT  [id number]  Kipp Laurence                        Sec  [id number]  .  .  .  .  .  Name:Michie, [name]  [id number]:[id number]  [phone]  Page 3 of 3  .  %%END

## 2019-03-12 LAB — CHLAMYDIA/GC (EXT)
Chlamydia trachomatis, Urine (EXT): NEGATIVE
Neisseria Gonorrhoeae, Urine (EXT): POSITIVE — AB

## 2019-06-08 LAB — HEP C AB WITH REFLEX QUANT (EXT)
HEPATITIS C ANTIBODY (EXT): NONREACTIVE
HEPATITIS C ANTIBODY (EXT): NONREACTIVE

## 2019-06-08 LAB — CHLAMYDIA/GC (EXT)
Chlamydia trachomatis, Urine (EXT): NEGATIVE
Neisseria Gonorrhoeae, Urine (EXT): NEGATIVE

## 2019-06-13 LAB — CHLAMYDIA/GC (EXT)
Chlamydia Trachomatis,Rectal (EXT): NOT DETECTED
Chlamydia trachomatis, Urine (EXT): NEGATIVE
Neisseria Gonorrhoeae, Urine (EXT): NEGATIVE
Neisseria gonorrhoeae, Rectal (EXT): NOT DETECTED

## 2019-06-14 LAB — CHLAMYDIA/GC (EXT)
Chlamydia trachomatis, Throat (EXT): NOT DETECTED
Neisseria gonorrhoeae, Throat (EXT): NOT DETECTED

## 2019-09-22 LAB — UNMAPPED LAB RESULTS
Chlamydia Trachomatis, Urine (EXT): NOT DETECTED
Neisseria Gonorrhoeae, Urine (EXT): NOT DETECTED

## 2019-11-12 ENCOUNTER — Emergency Department
Admit: 2019-11-12 | Discharge: 2019-11-12 | Disposition: A | Discharge status: Home / Self Care | Payer: No Typology Code available for payment source

## 2019-11-12 ENCOUNTER — Ambulatory Visit

## 2019-11-12 ENCOUNTER — Emergency Department
Admit: 2019-11-12 | Disposition: A | Discharge status: Home / Self Care | Source: Ambulatory Visit | Attending: Emergency Medicine | Admitting: Emergency Medicine

## 2019-11-12 ENCOUNTER — Ambulatory Visit: Admitting: Emergency Medicine

## 2019-11-12 LAB — HX MICRO-RESP VIRAL PANEL: HX COVID-19 (SARS-COV-2): NEGATIVE

## 2019-11-12 NOTE — ED Provider Notes (Signed)
 Marland Kitchen  Name: [name], [name]  MRN: [id number]  Age: 39 yrs  Sex: Male  DOB: January 26, 1981  Arrival Date: 11/12/2019  Arrival Time: 12:59  Account#: [id number]  .  Working Diagnosis: Acute pharyngitis, unspecified,  Suspected exposure to C  OVID  PCP: Amil Amen, M.  .  Historical:  - Allergies: Penicillins;  - Home Meds: Flonase Nasal;  - PMHx: HIV; Panic Attacks;  - Social history: Smoking status: Unable to obtain.  - The history from nurses notes was reviewed: and I agree with    what is documented.  .  .  Vital Signs:  08/23  13:01 BP 121 / 75; Pulse 106; Resp 16; Pulse Ox 95% on R/A; Pain 5/10;mt18  13:02 Temp 36.9(O);                                                   mt18  .  Glasgow Coma Score:  13:01 Eye Response: spontaneous(4). Verbal Response: oriented(5).     mt18        Motor Response: obeys commands(6). Total: 15.  14:00 Eye Response: spontaneous(4). Verbal Response: oriented(5).     jh40        Motor Response: obeys commands(6). Total: 15.  .  MDM:  14:39 Differential diagnosis: gastroesophageal reflux disease,        ny2        tonsillitis, uvulitis, viral syndrome foreign body. Special        discussion: Patient felt appropriate for discharge with        possible diagnosis of COVID Patient stable enough to receive        care at home Separate bedroom/space where patient can recover        without sharing immediate space with others Resources for food        and other necessities are available Patient and other household        members have access to PPE (gloves, facemask) and capable of        adhering to precautions recommended as part of home care        isolation Discussed with patient/family respiratory and hand        hygiene as well as cough etiquette Reviewed with patient/family        which individuals are at increased risk of complications of        COVID: >60, very young, immunocompromised, and those with        chronic heart, lung or kidney conditions Based on the history        and exam  findings, there is no indication for further emergent        testing or inpatient evaluation. I discussed with the        patient/guardian the need to see the primary care provider for        further evaluation of the symptoms. Data reviewed: lab test        result(s), rapid strep negative, vital signs. Counseling: I had        a detailed discussion with the patient and/or guardian        regarding: the historical points, exam findings, and any  .  Name:[name], [name]  [id number]  [id number]  Page 1 of 4  %%PAGE  .  Name: [name], [name]  MRN: [id number]  Age: 60  yrs  Sex: Male  DOB: 01/23/1981  Arrival Date: 11/12/2019  Arrival Time: 12:59  Account#: [id number]  .  Working Diagnosis: Acute pharyngitis, unspecified,  Suspected exposure to C  OVID  PCP: Amil Amen, M.  .        diagnostic results supporting the discharge diagnosis, need for        followup, to return to the emergency department if symptoms        worsen or persist or if there are any questions or concerns        that arise at home. Response to treatment: the patient's        symptoms have mildly improved after treatment.  14:39 Refusal of service: The patient/guardian displays adequate      ny2        decision making capability and despite a detailed discussion of        alternatives, benefits, risks, and consequences refuses: CT        Scan, States he is not concerned about foreign body, counseled        regarding return precautions..  .  08/23  13:09 Order name: EDIE_CAREPLAN; Complete Time: 13:44                 dispa  t  08/23  14:10 Order name: COVID-19 PCR (Symptomatic patients)                 ny2  08/23  14:10 Order name: Rapid Strep test(POC); Complete Time: 14:41         ny2  .  Dispensed Medications:  14:00 Drug: Acetaminophen 650 mg Route: PO;                           jh40  14:28 Drug: GI cocktail - (Maalox Suspension 30 mL, Lidocaine Viscous jh40        2 % 10 mL) Route: PO;  .  Marland Kitchen  Attending Notes:  14:04 Attending HPI: Social  History: Nonsmoker Nondrinker Patient     ny2        admits to Marijuana Use. Frequent use. HPI: 39 yo male pmh of        hiv well controlled anxiety c/o sore throat x 3 days no fevers        no rhinorrhea no cough. states worried about strep throat also        requesting COVID testing, states his mom tested +2 weeks ago,        he was exposed to her at that time. He was seen outside        hospital had negative COVID testing 2 days ago, also states        that symptoms started after eating crabs, denies any foreign        body sensation, no difficulty swallowing or breathing, no        difficulty tolerating secretions. Has not taken any pain        medications prior to arrival, but states he drinks ginger tea        daily.  14:17 Attestation: Assessment and care plan reviewed with             ny2        resident/midlevel provider. See their note for details.        Physician Assistant's history reviewed, patient interviewed and        examined. Attending ROS Constitutional: no fever /  chills        Cardiovascular: denies CP / palpitations Respiratory: no sob;  Marland Kitchen  Name:[name], [name]  [id number]  [id number]  Page 2 of 4  %%PAGE  .  Name: [name], [name]  MRN: [id number]  Age: 71 yrs  Sex: Male  DOB: [birth year]  Arrival Date: 11/12/2019  Arrival Time: 12:59  Account#: [id number]  .  Working Diagnosis: Acute pharyngitis, unspecified,  Suspected exposure to C  OVID  PCP: Amil Amen, M.  .        no cough Abdomen/GI: no abdominal pain / nausea / vomiting        /diarrhea Skin: no rashes or redness Neuro: no headache or        dizziness or weakness Hematologic/Lymphatic: no easy bruising;        or abnormal bleeding. Attending Exam: Constitutional: awake        alert Head/Face: NCAT; voice normal Respiratory: CTA bil; good        air mvmt bil; no rales / rhonchi / wh; no retractions; no        accessory muscle use; no stridor; speaks in full sentences        without difficulty. Cardiovascular: RR +S1 +S2 ; no  pulse        deficits Psych: affect normal, answers questions appropriately;        cooperative ENT: Posterior pharynx: erythema, mild , Airway:        normal, no evidence of obstruction, patent, Tonsils: are normal        in appearance, no enlargement, no erythema, no exudate, no        ulcerations, Uvula: normal, midline, non-edematous, no        erythema, Neck: External neck: is nomal, Lymph nodes: no        appreciated lymphadenopathy, Abdomen/GI: Palpation: abdomen is        soft and non-tender, Neuro: Orientation: is normal, Mentation:        is normal, Motor: strength is normal, Skin: Turgor: is        excellent, MS/Extremity: ROM: intact in all extremities,        Circulation: circulation is intact in all extremities, Psych:        Behavior/mood is pleasant, cooperative, anxious.  14:36 I have reviewed Nurses Notes, Old Records in: Online ED visits  ny2        database 2 previous ed visits for his complaint Outside Records.  14:39 ED Course: Rapid strep negative, patient afebrile nontoxic well ny2        appearing, no airway swelling on exam, no stridor, no other        abnormality. Recommend follow-up with PCP/ENT as needed,        counseled regarding return precautions. My Working Impression:        Pharyngitis. Attending chart complete and electronically        signed: Olean Ree; MD.  .  Disposition Summary:  11/12/19 14:44  Discharge Ordered        Location: Home -                                                ny2        Problem: an ongoing problem  ny2        Symptoms: have improved                                         ny2        Condition: Stable                                               ny2        Diagnosis          - Acute pharyngitis, unspecified                              ny2          - Suspected exposure to COVID                                 ny2        Followup:                                                       ny2          - With: ENT, [place]           - When: As needed  .  Name:[name], [name]  [id number]  [id number]  Page 3 of 4  %%PAGE  .  Name: [name], [name]  MRN: [id number]  Age: 73 yrs  Sex: Male  DOB: 02-Dec-1980  Arrival Date: 11/12/2019  Arrival Time: 12:59  Account#: [id number]  .  Working Diagnosis: Acute pharyngitis, unspecified,  Suspected exposure to C  OVID  PCP: Amil Amen, Judie Petit          - Reason: Continuance of care        Discharge Instructions:          - Discharge Summary Sheet                                     ny2          - PHARYNGITIS, Viral                                          ny2          - COVID-19 Suspected (Pending Testing)                        ny2        Forms:          - Medication Reconciliation Form                              ny2          - Fax Summary  ny2  SignaturesTheatre stage manager, [employer]                          dispa  Vivien Presto                         MD   ny2  Octavia Heir                         RN   [address] [place]                       CCT  [phone]  .  Corrections: (The following items were deleted from the chart)  14:20 14:04 Attending HPI: Social History: Nonsmoker Nondrinker       ny2        Patient admits to Marijuana Use. Frequent use. HPI: 39 yo male        pmh of hiv well controleld ny2  14:38 14:17 Attending Exam: Constitutional: awake alert Head/Face:    ny2        NCAT; voice normal Respiratory: CTA bil; good air mvmt bil; no        rales / rhonchi / wh; no retractions; no accessory muscle use;        no stridor; speaks in full sentences without difficulty.        Cardiovascular: RR +S1 +S2 ; no pulse deficits Psych: affect        normal, answers questions appropriately; cooperative ny2  .  Document is preliminary until electronically or manually signed by the atte  nding physician  .  .  .  .  .  .  .  .  .  .  .  .  .  .  .  .  .  Name:[name], [name]  [id number]  [id number]  Page 4 of 4  .  %%END

## 2019-11-12 NOTE — ED Provider Notes (Signed)
 .  .  Name: [name], [name]  MRN: [id number]  Age: 39 yrs  Sex: Male  DOB: 1980/06/25  Arrival Date: 11/12/2019  Arrival Time: 12:59  Account#: [id number]  Bed E-Eye  PCP: Amil Amen, Judie Petit.  Chief Complaint: Sore Throat  .  Presentation:  08/23  13:00 Presenting complaint: EMS states: sore throat x 2 days. Seen at mt18        [facility]. Pt reports COVID negative 2 days ago. Pt used amyl nitrate        prior to symptoms starting. Pt concerned for chemical burn.  13:00 Acuity: Adult 4                                                 mt18  13:00 Method Of Arrival: EMS: [employer]  .  Historical:  - Allergies:  13:01 Penicillins;                                                    mt18  - Home Meds:  13:01 Flonase Nasal [Active];                                         mt18  - PMHx:  13:01 HIV; Panic Attacks;                                             mt18  .  - Social history: Smoking status: Unable to obtain.  - The history from nurses notes was reviewed: and I agree with    what is documented.  .  .  Screening:  13:01 SEPSIS SCREENING SIRS Criteria (> = 2) No. Fall Risk None       mt18        identified. Exposure Risk/Travel Screening: COVID Symptomsquestion        Sore Throat. Known COVID 19 exposurequestion No. DPH requests        Isolationquestion(COVID) No. Have you tested + for COVIDquestion No. COVID 19        Vaccinequestion Yes-patient states they completed COVID vaccine        recommendations over 2 weeks ago.  .  Vital Signs:  13:01 BP 121 / 75; Pulse 106; Resp 16; Pulse Ox 95% on R/A; Pain 5/10;mt18  13:02 Temp 36.9(O);                                                   mt18  .  Glasgow Coma Score:  13:01 Eye Response: spontaneous(4). Verbal Response: oriented(5).     mt18        Motor Response: obeys commands(6). Total: 15.  14:00 Eye Response: spontaneous(4). Verbal  Response: oriented(5).     jh40        Motor Response: obeys commands(6). Total: 15.  .  Triage Assessment:  13:01  General: Appears in no apparent distress, Behavior is           mt18        appropriate for age, cooperative. Pain: Complains of pain in  .  Name:[name], [name]  [id number]  [id number]  Page 1 of 3  %%PAGE  .  Name: [name], [name]  MRN: [id number]  Age: 20 yrs  Sex: Male  DOB: 1980/05/05  Arrival Date: 11/12/2019  Arrival Time: 12:59  Account#: [id number]  Bed E-Eye  PCP: Amil Amen, Judie Petit.  Chief Complaint: Sore Throat  .        left aspect of posterior pharynx and right aspect of posterior        pharynx. Neuro: Eye opening: Spontaneously. Respiratory: Airway        is patent. Skin: Skin is normal.  .  Assessment:  14:00 Reassessment: 39 year old male presenting with a sore throat.   jh40        States that it has been going on for 5 days. Pt describes his        throat as irritated and swollen, states that the swelling comes        and goes. Pt says that he has been having difficulty swallowing        food, water, and vitamins. Pt says that he is allergic to        Penicillin and possibly shellfish, states he ate "a lot of crab        legs recently". General: Appears comfortable, well nourished,        well groomed, Behavior is appropriate for age, cooperative.        Neuro: Eye opening: Spontaneously Level on consciousness:        Sustained Attention Verbal Response: Orientation: Oriented x 3        Speech: Clear. Cardiovascular: Capillary refill < 3 seconds.        Respiratory: Airway is patent Respiratory effort is even,        unlabored, Reports shortness of breath pt states occasional        SOB. Skin: Skin is pink, warm / dry.  14:10 Reassessment: All noted by RN Eileen Stanford reviewed by this RN.        tl19  15:00 Reassessment: Pt discharged home. Told to follow up with        jh40        primary care and ENT clinic if needed. Told to return to ED if        worsening swelling, any SOB, more difficulty swallowing, or        fever/chills.  .  Observations:  12:59 Patient arrived in ED.                                           mt18  13:01 Triage Completed.                                               mt18  13:40 Patient Visited By: Vivien Presto  ny2  14:44 Patient Visited By: Loree Fee  .  Procedure:  14:28 COVID-19 PCR (Symptomatic patients) Sent.                       jh40  .  Dispensed Medications:  14:00 Drug: Acetaminophen 650 mg Route: PO;                           jh40  14:28 Drug: GI cocktail - (Maalox Suspension 30 mL, Lidocaine Viscous jh40        2 % 10 mL) Route: PO;  .  Marland Kitchen  Interventions:  13:01 Patient placed in waiting room in view of nurse.                mt18  .  Name:[name], [name]  [id number]  [id number]  Page 2 of 3  %%PAGE  .  Name: [name], [name]  MRN: [id number]  Age: 87 yrs  Sex: Male  DOB: January 28, 1981  Arrival Date: 11/12/2019  Arrival Time: 12:59  Account#: [id number]  Bed E-Eye  PCP: Amil Amen, Judie Petit.  Chief Complaint: Sore Throat  .  13:29 EMS Sheet Scanned into Chart                                    mm12  14:16 Demo Sheet Scanned into Chart                                   es28  14:40 POC Test Rapid Strep Negative.                                  jh40  .  Outcome:  14:44 Discharge ordered by MD.                                        Dyann Kief  15:02 Discharged to home ambulatory. Condition: stable. Discharge     jh40        instructions given to patient, Instructed on: discharge        instructions, follow up and referral plans. Discharge        Assessment: Patient awake, alert and oriented x 3. No cognitive        and/or functional deficits noted. Patient verbalized        understanding of disposition instructions. Chart Status Nursing        note complete and electronically signed.  15:02 Patient left the ED.                                            jh40  .  Signatures:  [place], Melissa                             mm12  [place], [place]  MD   ny2  Octavia Heir                         RN    [address] [place]                       CCT  jh40  Cecille Aver                          RN   tl19  Elyse Jarvis                Sec  es28  .  .  .  .  .  .  .  .  .  .  .  .  .  .  .  .  .  .  .  .  .  Name:[name], [name]  [id number]  [id number]  Page 3 of 3  .  %%END

## 2020-02-14 ENCOUNTER — Emergency Department
Admit: 2020-02-14 | Discharge: 2020-02-14 | Disposition: A | Discharge status: Home / Self Care | Payer: No Typology Code available for payment source

## 2020-02-14 ENCOUNTER — Ambulatory Visit: Admitting: Contractor

## 2020-02-14 ENCOUNTER — Ambulatory Visit

## 2020-02-14 ENCOUNTER — Emergency Department
Admit: 2020-02-14 | Disposition: A | Discharge status: Home / Self Care | Source: Home / Self Care | Attending: Emergency Medicine | Admitting: Emergency Medicine

## 2020-02-14 LAB — HX BF-URINALYSIS
HX HYALINE CAST: 4.9 /LPF
HX KETONES: NEGATIVE mg/dL
HX NITRITE LEVEL: NEGATIVE
HX RED BLOOD CELLS: 5 /HPF
HX SPECIFIC GRAVITY: 1.023
HX SQUAMOUS EPITHELIAL CELLS: NEGATIVE /HPF
HX U BACTERIA: NEGATIVE
HX U BILIRUBIN: NEGATIVE
HX U BLOOD: NEGATIVE
HX U GLUCOSE: NEGATIVE mg/dL
HX U PH: >=9
HX U PROTEIN: NEGATIVE mg/dL
HX U UROBILINIG: 1 EU
HX WHITE BLOOD CELLS: 57 /HPF — AB

## 2020-02-14 LAB — HX HEM-ROUTINE
HX BASO #: 0 10*3/uL (ref 0.0–0.2)
HX BASO: 0 %
HX EOSIN #: 0 10*3/uL (ref 0.0–0.5)
HX EOSIN: 1 %
HX HCT: 37.9 % (ref 37.0–47.0)
HX HGB: 12.1 g/dL — ABNORMAL LOW (ref 13.5–16.0)
HX IMMATURE GRANULOCYTE#: 0 10*3/uL (ref 0.0–0.1)
HX IMMATURE GRANULOCYTE: 0 %
HX LYMPH #: 3.3 10*3/uL (ref 1.0–4.0)
HX LYMPH: 46 %
HX MCH: 28.5 pg (ref 26.0–34.0)
HX MCHC: 31.9 g/dL — ABNORMAL LOW (ref 32.0–36.0)
HX MCV: 89.2 fL (ref 80.0–98.0)
HX MONO #: 0.8 10*3/uL (ref 0.2–0.8)
HX MONO: 11 %
HX MPV: 9.5 fL (ref 9.1–11.7)
HX NEUT #: 3.1 10*3/uL (ref 1.5–7.5)
HX NRBC #: 0 10*3/uL
HX NUCLEATED RBC: 0 %
HX PLT: 232 10*3/uL (ref 150–400)
HX RBC BLOOD COUNT: 4.25 M/uL (ref 4.20–5.50)
HX RDW: 13.2 % (ref 11.5–14.5)
HX SEG NEUT: 42 %
HX WBC: 7.3 10*3/uL (ref 4.0–11.0)

## 2020-02-14 LAB — HX MICRO-RESP VIRAL PANEL: HX COVID-19 (SARS-COV-2) RAPID: NEGATIVE

## 2020-02-14 NOTE — ED Provider Notes (Signed)
 Marland Kitchen  Name: [name], [name]  MRN: [id number]  Age: 39 yrs  Sex: Male  DOB: 02-01-1981  Arrival Date: 02/14/2020  Arrival Time: 13:27  Account#: [id number]  .  Working Diagnosis: Other urethritis  PCP: Amil Amen, M.  .  HPI:  11/25  14:00 The pt reports 2 days of malaise, myalgias, and a mild cough.   jms        No chest pain, SOB, N/V. He reports that he is COVID        vaccinated. He has a Hx of HIV+. He also noted that he was        treated for a STI 2 weeks ago with "4 pills" but his sxs of        dysuria returned a few days ago. .  .  Historical:  - Allergies: Penicillins;  - Home Meds: Biktarvy 50-200-25 mg oral tab; Flonase Nasal; Claritin-D 24    Hour 10-240 mg Oral Tb24;  - PMHx: Panic Attacks; HIV;  - Social history: Smoking status: Patient occasionally smokes.    Patient uses Marijuana Preferred Language: English.  .  .  ROS:  13:59 Constitutional: Positive for body aches, chills, Negative for   jms        fever.  13:59 Eyes: Negative for pain, redness.  13:59 ENT: Negative for sore throat.  13:59 Cardiovascular: Negative for chest pain, palpitations.  13:59 Respiratory: Negative for cough, shortness of breath.  13:59 Abdomen/GI: Negative for abdominal pain, nausea, vomiting,        diarrhea.  13:59 GU: Positive for burning with urination, penile discharge.  13:59 Neuro: Negative for headache, weakness.  13:59 Psych: Negative for drug dependence, alcohol dependence.  .  Vital Signs:  13:35 Resp 20;                                                        nc21  13:37 BP 131 / 85; Pulse 97; Resp 20; Temp 36.7; Pulse Ox 95% on R/A; nc21        Pain 6/10;  .  Glasgow Coma Score:  13:35 Eye Response: spontaneous(4). Verbal Response: oriented(5).     nc21        Motor Response: obeys commands(6). Total: 15.  14:16 Eye Response: spontaneous(4). Verbal Response: oriented(5).     lk        Motor Response: obeys commands(6). Total: 15.  14:16 Eye Response: spontaneous(4). Verbal Response: oriented(5).     lk         Motor Response: obeys commands(6). Total: 15.  .  Name:[name], [name]  [id number]  [id number]  Page 1 of 4  %%PAGE  .  Name: [name], [name]  MRN: [id number]  Age: 39 yrs  Sex: Male  DOB: 1980-08-08  Arrival Date: 02/14/2020  Arrival Time: 13:27  Account#: [id number]  .  Working Diagnosis: Other urethritis  PCP: Amil Amen, M.  .  .  Exam:  14:00 Constitutional: The patient appears well nourished, alert, well jms        developed.  14:00 Head/face: Exam is negative for obvious evidence of injury or        deformity.  14:00 Eyes: Conjunctiva: normal.  14:00 ENT: Voice: is normal.  14:00 Respiratory:  Respirations: normal.  14:00 Cardiovascular: Rate: actual rate is  97 bpm.  14:00 Psych: Behavior/mood is cooperative, Affect is calm.  14:00 Neuro: Orientation: to person, place / time. Mentation: lucid,        able to follow commands, Cranial nerves: Grossly intact Motor:        is normal, Gait: is steady.  .  MDM:  15:17 Differential diagnosis: GC, Chlamydia, COVID19. Data reviewed:  jms        nurses notes.  15:18 Data reviewed: radiologic studies, I visualized the pts chest   jms        radiograph. The heart / mediastinal structure are normal. No        pleural effusion or pulmonary infiltrates are seen..  15:32 Counseling: I had a detailed discussion with the patient and/or jms        guardian regarding: the historical points, exam findings, and        any diagnostic results supporting the discharge diagnosis, need        for followup, to return to the emergency department if symptoms        worsen or persist or if there are any questions or concerns        that arise at home.  .  11/25  13:59 Order name: UA w/Refl Cult; Complete Time: 14:52                jms  11/25  13:59 Order name: Chlamydia DNA Probe (NEW); Complete Time: 14:52     jms  11/25  13:59 Order name: GC DNA PROBE (NEW); Complete Time: 14:52            jms  11/25  13:59 Order name: COVID-19 PCR (Symptomatic patients)                  jms  11/25  14:50 Order name: Urine Microscopic; Complete Time: 14:52             dispa  t  11/25  15:07 Order name: CBC/Diff (With Plt)                                 jms  11/25  13:57 Order name: CXR (PA/Lat); Complete Time: 14:52                  jms  .  Name:[name], [name]  [id number]  [id number]  Page 2 of 4  %%PAGE  .  Name: [name], [name]  MRN: [id number]  Age: 39 yrs  Sex: Male  DOB: 05-Oct-1980  Arrival Date: 02/14/2020  Arrival Time: 13:27  Account#: [id number]  .  Working Diagnosis: Other urethritis  PCP: Amil Amen, M.  .  11/25  13:59 Order name: Urine Dip (POC); Complete Time: 14:15               jms  11/25  15:22 Order name: COVID-19 (SARS-CoV-2) PCR RAPID                     dispa  t  11/25  15:41 Order name: Treponemal Antibody                                 jms  .  Dispensed Medications:  15:15 Drug: cefTRIAXone 500 mg Route: IM; Site: right gluteus;        lk  .  Marland Kitchen  Radiology Orders:  Order Name: CXR (PA/Lat); Last  Status: Reviewed; Time: 02/14/20    13:57; By: Marnee Guarneri; For: jms; Order Method: Electronic; Notes:    Bed Name: E3  Attending Notes:  15:33 ED Course: The pt was stable in the ED. His COVID test is       jms        NEGATIVE. His CXR looks good. He has cells in the urine so he        is treated for urthritis. My Working Impression: Urethritis,        cough. Attending chart complete and electronically signed:        J.M.Jeannett Senior MD [phone].  Marland Kitchen  Disposition Summary:  02/14/20 15:36  Discharge Ordered        Location: Home -                                                jms        Problem: new                                                    jms        Symptoms: are unchanged                                         jms        Condition: Stable                                               jms        Diagnosis          - Other urethritis                                            jms        Followup:                                                       jms          - With: Private  Physician          - When: As needed          - Reason:        Discharge Instructions:          - Discharge Summary Sheet                                     jms          - URETHRITIS, Male (GC vs. Chlam)  jms        Forms:          - Medication Reconciliation Form                              jms          - Fax Summary                                                 jms        Prescriptions:  .  Name:[name], [name]  [id number]  [id number]  Page 3 of 4  %%PAGE  .  Name: [name], [name]  MRN: [id number]  Age: 20 yrs  Sex: Male  DOB: [birth year]  Arrival Date: 02/14/2020  Arrival Time: 13:27  Account#: [id number]  .  Working Diagnosis: Other urethritis  PCP: Amil Amen, M.  .          - Doxycycline Hyclate 100 mg Oral Tablet              - take 1 tablet by ORAL route 2 times per day for 7 days  jms        Take with lots of fluids; 14 tablet; Refills: 0, Product        Selection Permitted          - Albuterol Sulfate 90 mcg/actuation Inhalation HFA Aerosol        Inhaler              - inhale 2 puffs by INHALATION route every 4 hours; 1     jms        Inhaler; Refills: 0, Product Selection Permitted  Signatures:  Barbette Merino                          MD   jms  Dispatcher, [employer]                          dispa  Barrie Folk                            RN   lk  [place]ou[place], Sparkman                       RN   nc21  Bunnie Pion                            Reg  cl30  .  Corrections: (The following items were deleted from the chart)  13:36 13:35 Allergies: Biktarvy; nc21                                 nc21  15:08 15:07 RPR+Immunology/Serology ordered. dispat                   dispa  t  15:24 15:24 RPR+Immunology/Serology ordered. dispat                   dispa  t  .  Document is preliminary until electronically or manually signed by the atte  nding physician  .  .  .  .  .  .  .  .  .  .  .  .  .  .  .  .  .  .  .  .  .  Marland Kitchen  Name:[name], [name]  SMO:[id number]  [id number]  Page 4 of  4  .  %%END

## 2020-02-14 NOTE — ED Provider Notes (Signed)
 .  .  Name: [name], Liford  MRN: [id number]  Age: 39 yrs  Sex: Male  DOB: 17-Jan-1981  Arrival Date: 02/14/2020  Arrival Time: 13:27  Account#: [id number]  Bed E3  PCP: Amil Amen, Judie Petit.  Chief Complaint: Cold Symptoms  .  Presentation:  11/25  13:34 Presenting complaint: Patient states: treated for gonorrhea 2   nc21        weeks ago will pills, developed same pain and discharge a week        ago, +cough, denies fevers.  13:34 Method Of Arrival: Walk In                                      nc21  13:34 Acuity: Adult 3                                                 nc21  .  Historical:  - Allergies:  13:35 Penicillins;                                                    nc21  - Home Meds:  13:35 Biktarvy 50-200-25 mg oral tab [Active]; Flonase Nasal          nc21        [Active]; Claritin-D 24 Hour 10-240 mg Oral Tb24 [Active];  - PMHx:  13:35 Panic Attacks; HIV;                                             nc21  .  - Social history: Smoking status: Patient occasionally smokes.    Patient uses Marijuana Preferred Language: English.  .  .  Screening:  13:35 Safety screen: Patient feels safe. Fall Risk None identified.   nc21        Exposure Risk/Travel Screening: COVID Symptomsquestion Cough. Body        aches. Known COVID 19 exposurequestion No. DPH requests        Isolationquestion(COVID) No. Have you tested + for COVIDquestion No. COVID 19        Vaccinequestion Yes-patient states they completed COVID vaccine        recommendations over 2 weeks ago.  14:16 Suicide (ED Safe) Screening: In the past two weeks have you     lk        felt down, depressed or hopelessquestion No. Suicidal thoughts: Over        the past two weeks patient denies thoughts of killing self.        Nutritional screening: No deficits noted.  .  Vital Signs:  13:35 Resp 20;                                                        nc21  13:37 BP 131 / 85; Pulse 97; Resp 20; Temp 36.7; Pulse  Ox 95% on R/A; nc21        Pain 6/10;  .  Glasgow Coma  Score:  13:35 Eye Response: spontaneous(4). Verbal Response: oriented(5).     nc21        Motor Response: obeys commands(6). Total: 15.  14:16 Eye Response: spontaneous(4). Verbal Response: oriented(5).     lk  .  Name:[name]  [id number]  [id number]  Page 1 of 3  %%PAGE  .  Name: [name], [name]  MRN: [id number]  Age: 20 yrs  Sex: Male  DOB: [birth year]  Arrival Date: 02/14/2020  Arrival Time: 13:27  Account#: [id number]  Bed E3  PCP: Amil Amen, Judie Petit.  Chief Complaint: Cold Symptoms  .        Motor Response: obeys commands(6). Total: 15.  14:16 Eye Response: spontaneous(4). Verbal Response: oriented(5).     lk        Motor Response: obeys commands(6). Total: 15.  .  Triage Assessment:  13:35 General: Appears in no apparent distress, Behavior is           nc21        appropriate for age, cooperative. Pain: Pain currently is 6/10.        Respiratory: No deficits noted. Airway is patent Trachea        midline Respiratory effort is even, unlabored, Respiratory        pattern is regular, symmetrical.  .  Assessment:  14:16 General: Appears comfortable, Behavior is cooperative. Pain:    lk        Complains of pain in some abd pain and also some dysuria Pain        currently is 6/10 Quality of pain is described as. Neuro: No        deficits noted. EENT: No deficits noted. Cardiovascular: Rhythm        is regular Chest pain is denied. Respiratory: Airway is patent        Trachea midline Respiratory effort is even, unlabored,        Respiratory pattern is regular, symmetrical, Reports cough that        is. GI: Reports some abd pain and dysuria. GI: No deficits        noted. GU: Reports pain with urination. Skin: No deficits        noted. Musculoskeletal: No deficits noted.  .  Observations:  13:27 Patient arrived in ED.                                          [id number]  13:35 Triage Completed.                                               nc21  13:37 Registration completed.                                          cl30  13:37 Patient Visited By: Ventura Bruns  13:44 Patient Visited By: Barbette Merino  jms  .  Procedure:  14:15 COVID-19 PCR (Symptomatic patients) Sent.                       lk  14:15 Chlamydia DNA Probe (NEW) Sent.                                 lk  14:15 GC DNA PROBE (NEW) Sent.                                        lk  14:15 UA w/Refl Cult Sent.                                            lk  14:15 Urine collected.                                                lk  15:31 CBC/Diff (With Plt) Sent.                                       lk  15:50 Treponemal Antibody Sent.                                       lk  .  Dispensed Medications:  15:15 Drug: cefTRIAXone 500 mg Route: IM; Site: right gluteus;        lk  .  Marland Kitchen  Name:[name]  [id number]  [id number]  Page 2 of 3  %%PAGE  .  Name: [name]  MRN: [id number]  Age: 78 yrs  Sex: Male  DOB: Jul 31, 1980  Arrival Date: 02/14/2020  Arrival Time: 13:27  Account#: [id number]  Bed E3  PCP: Amil Amen, Judie Petit.  Chief Complaint: Cold Symptoms  .  Marland Kitchen  Interventions:  13:40 Demo Sheet Scanned into Chart                                   es28  14:15 Urine DipResults: Specific Gravity 1.010 Ph- 9 Leukocytes       lk        Negative. Nitrites- Negative. Protein TRace Glucose- Normal.        Ketones Negative. Urobilinogen Negative Bilirubin Negative        Blood- Negative.  14:16 Armband on.                                                     lk  .  Outcome:  15:36 Discharge ordered by MD.  jms  15:50 Discharged to home ambulatory. Condition: stable. Discharge     lk        instructions given to patient, Instructed on: discharge        instructions, follow up and referral plans. Demonstrated        understanding of instructions, Prescriptions given X 2. Chart        Status Nursing note complete and electronically signed.  15:50 Patient left the ED.                                             lk  .  Corrections: (The following items were deleted from the chart)  13:36 13:35 Allergies: Biktarvy; nc21                                 nc21  .  Signatures:  Barbette Merino                          MD   jms  Barrie Folk                            RN   lk  [place], [place]                       RN   nc21  [place], [facility]                Sec  es28  [place], [place]                        Reg  mm80  Bunnie Pion                            Reg  cl30  .  .  .  .  .  .  .  .  .  .  .  .  .  .  .  Name:Kooi, Joash  [id number]  [id number]  Page 3 of 3  .  %%END

## 2020-02-15 LAB — HX MOLECULAR BIO
HX CHLAMYDIA DNA PROBE: NEGATIVE
HX CHLAMYDIA DNA PROBE: NEGATIVE
HX GC DNA PROBE: NEGATIVE
HX GC DNA PROBE: NEGATIVE

## 2020-02-15 LAB — HX CHLAMYDIA: HX CHLAMYDIA DNA PROBE: NEGATIVE

## 2020-02-15 LAB — HX MICRO

## 2020-02-19 LAB — HX IMMUNOLOGY
HX RPR QUANT: 1:1 {titer} — AB
HX TREPONEMAL ANTIBODY: REACTIVE — AB

## 2020-04-26 LAB — LIPID PROFILE (EXT)
Chol/HDL Ratio (EXT): 4.8 (calc) (ref <5.0)
Cholesterol (EXT): 178 mg/dL (ref <200)
HDL Cholesterol (EXT): 37 mg/dL — ABNORMAL LOW (ref >=40)
LDL Cholesterol, CALC (EXT): 124 mg/dL — ABNORMAL HIGH
NON HDL Cholesterol (EXT): 141 mg/dL — ABNORMAL HIGH (ref <130)
Triglycerides (EXT): 73 mg/dL (ref <150)

## 2020-04-29 LAB — CHLAMYDIA/GC (EXT)
Chlamydia Trachomatis,Rectal (EXT): NOT DETECTED
Chlamydia trachomatis, Throat (EXT): NOT DETECTED
Neisseria gonorrhoeae, Rectal (EXT): NOT DETECTED
Neisseria gonorrhoeae, Throat (EXT): NOT DETECTED

## 2021-01-07 LAB — HEP C AB WITH REFLEX QUANT (EXT): HEPATITIS C ANTIBODY (EXT): NONREACTIVE

## 2021-01-09 LAB — UNMAPPED LAB RESULTS
Chlamydia Trachomatis, Urine (EXT): NOT DETECTED
Neisseria Gonorrhoeae, Urine (EXT): NOT DETECTED

## 2021-01-11 LAB — CHLAMYDIA/GC (EXT)
Chlamydia Trachomatis,Rectal (EXT): NOT DETECTED
Neisseria gonorrhoeae, Rectal (EXT): NOT DETECTED

## 2021-03-04 ENCOUNTER — Other Ambulatory Visit

## 2021-03-04 ENCOUNTER — Encounter

## 2021-03-04 ENCOUNTER — Encounter (HOSPITAL_BASED_OUTPATIENT_CLINIC_OR_DEPARTMENT_OTHER)

## 2021-03-04 ENCOUNTER — Emergency Department: Admit: 2021-03-04 | Payer: MEDICAID | Primary: Pediatrics

## 2021-03-04 ENCOUNTER — Inpatient Hospital Stay
Admit: 2021-03-04 | Discharge: 2021-03-04 | Disposition: A | Discharge status: Home / Self Care | Payer: MEDICAID | Attending: Emergency Medicine

## 2021-03-04 DIAGNOSIS — J101 Influenza due to other identified influenza virus with other respiratory manifestations: Secondary | ICD-10-CM

## 2021-03-04 LAB — SARS/FLU/RSV
Influenza A RNA: DETECTED — AB
Influenza B RNA: NOT DETECTED
Respiratory syncytial virus: NOT DETECTED
SARS-CoV-2 RNA PCR: NEGATIVE

## 2021-03-04 MED ORDER — ondansetron ODT (Zofran-ODT) 4 mg disintegrating tablet
4 | ORAL_TABLET | Freq: Three times a day (TID) | ORAL | 0 refills | Status: AC | PRN
Start: 2021-03-04 — End: 2021-03-11
  Filled 2021-03-04: qty 10, 4d supply, fill #0

## 2021-03-04 MED ORDER — benzonatate (Tessalon) 100 mg capsule
100 | ORAL_CAPSULE | Freq: Three times a day (TID) | ORAL | 0 refills | 10.00000 days | Status: AC
Start: 2021-03-04 — End: 2021-03-11
  Filled 2021-03-04: qty 21, 7d supply, fill #0

## 2021-03-04 MED ORDER — acetaminophen (Tylenol) tablet 975 mg
325 | Freq: Once | ORAL | Status: AC
Start: 2021-03-04 — End: 2021-03-04
  Administered 2021-03-04: 22:00:00 975 mg via ORAL

## 2021-03-04 MED FILL — ACETAMINOPHEN 325 MG TABLET: 325 325 mg | ORAL | Qty: 3

## 2021-03-04 NOTE — ED Triage Notes (Signed)
 Patient to ED c/o productive cough, n/v, fever, chills x 5 days. EMS states SPO2 was 93% upon their arrival, placed on NRB by fire dept. Patient in NARD upon arrival to ED, states has been tolerating PO fluids.

## 2021-03-04 NOTE — ED Provider Notes (Signed)
 ATTENDING NOTE    Chief Complaint   Patient presents with   . Flu Symptoms       History of Present Illness  Electronic Medical Records Reviewed: Yes  Medical Interpreter Used: No  History Reviewed and Discussed with None      40 y.o. male pt who  has a past medical history of HIV (human immunodeficiency virus infection) (CMS/HCC). presents via EMS for evaluation of flu-like symptoms. Pt reports 5 days of productive cough, subjective fever, chills, SOB, diaphoresis, nausea, and vomiting. Per EMS report, pt was noted to be satting 93% on their arrival, and was subsequently placed on a non-rebreather mask prior to transport to [facility]. Pt reports sick contact through his son, who was diagnosed with Influenza A, and who's similar symptoms began several days before his own.       History/Exam limitations: none.    Past Medical/Surgical History    Past Medical History:   Diagnosis Date   . HIV (human immunodeficiency virus infection) (CMS/HCC)      There is no problem list on file for this patient.    History reviewed. No pertinent surgical history.    Medications    Discharge Medication List as of 03/04/2021  6:37 PM      CONTINUE these medications which have NOT CHANGED    Details   cetirizine (ZyrTEC) 10 mg tablet Take 1 tablet by mouth in the morning., Starting Wed 01/14/2021, Historical Med      Biktarvy 50-200-25 mg tablet Take 1 tablet by mouth in the morning., Starting Tue 07/15/2020, Historical Med      fluticasone (Flonase) 50 mcg/actuation nasal spray 1 spray in the morning., Starting Wed 01/14/2021, Historical Med      sertraline (Zoloft) 25 mg tablet Take 25 mg by mouth in the morning., Starting Mon 07/21/2020, Historical Med      Ventolin HFA 90 mcg/actuation inhaler Inhale 2 puffs every 4 (four) hours if needed., Starting Wed 01/14/2021, Historical Med              Allergies    Allergies   Allergen Reactions   . Penicillins Unknown     Unknown, just knows he can't take it  Mother told him, he believes he  may have had it without issue  Other reaction(s): Unknown  Unknown, just knows he can't take it  Mother told him, he believes he may have had it without issue         Social History    Social History     Tobacco Use   . Smoking status: Never   . Smokeless tobacco: Never   Substance Use Topics   . Alcohol use: Not on file     Social History     Substance and Sexual Activity   Drug Use Yes   . Types: Marijuana       Family History    No family history on file.    Review of Systems     Review of Systems   Constitutional: Positive for chills, diaphoresis and fatigue. Negative for fever.   HENT: Positive for congestion and rhinorrhea.    Respiratory: Positive for cough and shortness of breath.         Positive for green sputum production.   Cardiovascular: Negative for chest pain and palpitations.   Gastrointestinal: Positive for nausea and vomiting. Negative for abdominal pain and diarrhea.   Genitourinary: Negative for flank pain.   Musculoskeletal: Negative for back pain, neck pain and  neck stiffness.   Skin: Negative for rash.   Neurological: Negative for dizziness, weakness and headaches.   All other systems reviewed and are negative.        Physical Exam  ED Triage Vitals [03/04/21 1543]   Temp Pulse Resp BP   37.6 C (99.7 F) 95 17 115/74      SpO2 Temp Source Heart Rate Source Patient Position   97 % Temporal -- --      BP Location FiO2 (%)     -- --          Constitutional: Well developed, well nourished, NAD.   Head: NC/AT.  Eyes: PERRL, EOMI.   ENT: Normal external nose and ears, posterior OP benign. Mild rhinorrhea.  Neck: Supple, full ROM, no cervical LAD.  Pulmonary: Unlabored respiratory effort. Intermittent cough.   CVS: RRR, normal S1 and S2, no m/g/r.  Abdomen: Soft, nontender, nondistended, normal bowel sounds.  Extremities: NVI, full ROM, pulses equal.  Skin: Warm, dry, no rashes.  Neuro: Awake and alert, lucid mentation, face symmetric.  Psych: A&Ox3, behavior, mood, and affect are within normal  limits.      Labs/Imaging  Labs Reviewed   SARS/FLU/RSV - Abnormal       Result Value    Influenza A RNA Detected (*)     Influenza B RNA Not Detected      SARS-CoV-2 RNA PCR Negative      Respiratory syncytial virus Not Detected        XR CHEST 2 VIEWS   Final Result   No acute pulmonary abnormality.      DICTATED: Wendie Agreste, MD   IN ACCORDANCE WITH DEPARTMENT POLICY, TEACHING PHYSICIANS REVIEW ALL IMAGES, AND EDIT REPORTS AS REQUIRED. A REPORT IS NOT FINAL UNTIL APPROVED BY A STAFF RADIOLOGIST.      APPROVED BY STAFF RADIOLOGIST: Moishe Spice 03/05/2021 8:10 AM EST            ED Course  ED Course as of 03/05/21 1648   Wed Mar 04, 2021   1751 XR CHEST 2 VIEWS  Independently visualized and interpreted by me: No infiltrates.  [IH]   1829 FluA+ consistent with syndrome. Tolerating PO. Sats stable. Plan for supportive care and outpt fu.  [KL]      ED Course User Index  [IH] [facility]  [KL] Louie Bun, MD         Diagnoses as of 03/05/21 1648   Influenza A   Nausea and vomiting, unspecified vomiting type   Acute cough         Assessment and Plan  40 y/o male presents for evaluation of flu-like symptoms. CXR was negative for acute pathology, and viral panel shows Flu A positive       Final Disposition: Discharged      Clinical Impression  1. Influenza A    2. Nausea and vomiting, unspecified vomiting type    3. Acute cough        By signing my name below, I, Pilar Grammes, attest that this documentation has been prepared under the direction and presence of Dr. Valera Castle, DO.       Electronically signed: Pilar Grammes, Scribe. 03/05/21 4:48 PM.    Consent to use a scribe was obtained prior to the start of the encounter by clinical staff.           [place] Metro Kung, MD  03/05/21 1649

## 2021-03-04 NOTE — Discharge Instructions (Signed)
 Follow up with your PCP     Take the medication as prescribed.     Return if worse or any new or worsening symptoms or complaints.

## 2021-03-04 NOTE — ED Provider Notes (Signed)
 EMERGENCY DEPARTMENT ENCOUNTER    TRANSFER OF CARE:  ATTENDING NOTE      6:29 PM:  I assumed care of patient at signout.  Briefly, [name] is a 40 y.o. male who  has a past medical history of HIV (human immunodeficiency virus infection) (CMS/HCC)..  The patient presents with Flu Symptoms       HPI: please see note by attending physician handing off.    ED Course as of 03/04/21 1829   Wed Mar 04, 2021   1751 XR CHEST 2 VIEWS  Independently visualized and interpreted by me: No infiltrates.  [IH]   1829 FluA+ consistent with syndrome. Tolerating PO. Sats stable. Plan for supportive care and outpt fu.  [KL]      ED Course User Index  [IH] [facility]  [KL] Louie Bun, MD         Diagnoses as of 03/04/21 1829   Influenza A   Nausea and vomiting, unspecified vomiting type   Acute cough         CLINICAL IMPRESSION:    1. Influenza A    2. Nausea and vomiting, unspecified vomiting type    3. Acute cough         Louie Bun, MD  03/04/21 [id number]

## 2021-03-12 ENCOUNTER — Other Ambulatory Visit

## 2021-03-12 ENCOUNTER — Emergency Department: Admit: 2021-03-12 | Payer: MEDICAID | Primary: Pediatrics

## 2021-03-12 ENCOUNTER — Inpatient Hospital Stay
Admit: 2021-03-12 | Discharge: 2021-03-12 | Disposition: A | Discharge status: Home / Self Care | Payer: MEDICAID | Attending: Emergency Medicine

## 2021-03-12 ENCOUNTER — Encounter (HOSPITAL_BASED_OUTPATIENT_CLINIC_OR_DEPARTMENT_OTHER)

## 2021-03-12 DIAGNOSIS — J181 Lobar pneumonia, unspecified organism: Secondary | ICD-10-CM

## 2021-03-12 DIAGNOSIS — J1008 Influenza due to other identified influenza virus with other specified pneumonia: Secondary | ICD-10-CM

## 2021-03-12 LAB — COMPREHENSIVE METABOLIC PANEL
ALT: 48 U/L (ref 0–55)
AST: 28 U/L (ref 6–42)
Albumin: 4.3 g/dL (ref 3.2–5.0)
Alkaline phosphatase: 73 U/L (ref 30–130)
Anion Gap: 8 mmol/L (ref 3–14)
BUN: 17 mg/dL (ref 6–24)
Bilirubin, total: 0.6 mg/dL (ref 0.2–1.2)
CO2 (Bicarbonate): 26 mmol/L (ref 20–32)
Calcium: 9.7 mg/dL (ref 8.5–10.5)
Chloride: 104 mmol/L (ref 98–110)
Creatinine: 1.43 mg/dL — ABNORMAL HIGH (ref 0.55–1.30)
Glucose: 91 mg/dL (ref 70–139)
Potassium: 4 mmol/L (ref 3.6–5.2)
Protein, total: 7.9 g/dL (ref 6.0–8.4)
Sodium: 138 mmol/L (ref 135–146)
eGFRcr: 63 mL/min/{1.73_m2} (ref >=60)

## 2021-03-12 LAB — CALCIUM, TOTAL: Calcium: 9.7 mg/dL (ref 8.5–10.5)

## 2021-03-12 LAB — CBC WITH DIFFERENTIAL
Basophils %: 0.1 %
Basophils Absolute: 0.01 10*3/uL (ref 0.00–0.22)
Eosinophils %: 0.1 %
Eosinophils Absolute: 0.01 10*3/uL (ref 0.00–0.50)
Hematocrit: 34.7 % — ABNORMAL LOW (ref 37.0–53.0)
Hemoglobin: 11 g/dL — ABNORMAL LOW (ref 13.0–17.5)
Immature Granulocytes %: 0.6 %
Immature Granulocytes Absolute: 0.05 10*3/uL (ref 0.00–0.10)
Lymphocyte %: 18.9 %
Lymphocytes Absolute: 1.71 10*3/uL (ref 0.70–4.00)
MCH: 27.8 pg (ref 26.0–34.0)
MCHC: 31.7 g/dL (ref 31.0–37.0)
MCV: 87.8 fL (ref 80.0–100.0)
MPV: 9.9 fL (ref 9.1–12.4)
Monocytes %: 16.3 %
Monocytes Absolute: 1.48 10*3/uL — ABNORMAL HIGH (ref 0.38–0.83)
NRBC %: 0 % (ref 0.0–0.0)
NRBC Absolute: 0 10*3/uL (ref 0.00–2.00)
Neutrophil %: 64 %
Neutrophils Absolute: 5.81 10*3/uL (ref 1.50–7.95)
Platelets: 390 10*3/uL (ref 150–400)
RBC: 3.95 M/uL — ABNORMAL LOW (ref 4.20–5.90)
RDW-CV: 12.9 % (ref 11.5–14.5)
RDW-SD: 41.8 fL (ref 35.0–51.0)
WBC: 9.1 10*3/uL (ref 4.0–11.0)

## 2021-03-12 LAB — CD4 PANEL (PERFORMABLE)
% CD4: 35.5 % (ref 30.0–65.0)
Absolute CD4 Count: 0.614 10*3/uL (ref 0.550–1.650)
T Cell Absolute Lymphocyte Count: 1.7 10*3/uL
T Cell Lymphocyte %: 19 %
T Cell WBC: 9.1 10*3/uL

## 2021-03-12 LAB — PHOSPHORUS: Phosphorus: 2.6 mg/dL (ref 2.4–4.9)

## 2021-03-12 LAB — C-REACTIVE PROTEIN: CRP: 42.28 mg/L — ABNORMAL HIGH (ref 0.00–4.99)

## 2021-03-12 LAB — SEDIMENTATION RATE, AUTOMATED: Sed Rate: 106 mm/h — ABNORMAL HIGH (ref 0–15)

## 2021-03-12 LAB — TREPONEMA PALLIDUM (SYPHILIS) AB (RPR): T pallidum Antibodies: REACTIVE — AB

## 2021-03-12 LAB — MONONUCLEOSIS SCREEN: Mononucleosis Screen: NEGATIVE

## 2021-03-12 MED ORDER — doxycycline (Vibra-Tabs) tablet 100 mg
100 | Freq: Once | ORAL | Status: AC
Start: 2021-03-12 — End: 2021-03-12
  Administered 2021-03-12: 19:00:00 100 mg via ORAL

## 2021-03-12 MED ORDER — sodium chloride 0.9 % bolus 1,000 mL
0.9 | Freq: Once | INTRAVENOUS | Status: AC
Start: 2021-03-12 — End: 2021-03-12
  Administered 2021-03-12: 16:00:00 1000 mL via INTRAVENOUS

## 2021-03-12 MED ORDER — LORazepam (Ativan) tablet 0.5 mg
1 | Freq: Once | ORAL | Status: AC
Start: 2021-03-12 — End: 2021-03-12
  Administered 2021-03-12: 18:00:00 0.5 mg via ORAL

## 2021-03-12 MED ORDER — doxycycline (Vibra-Tabs) 100 mg tablet
100 | ORAL_TABLET | Freq: Two times a day (BID) | ORAL | 0 refills | Status: AC
Start: 2021-03-12 — End: 2021-03-17
  Filled 2021-03-12: qty 10, 5d supply, fill #0

## 2021-03-12 MED ORDER — amoxicillin-pot clavulanate (Augmentin) 875-125 mg per tablet 1 tablet
875-125 | Freq: Once | ORAL | Status: AC
Start: 2021-03-12 — End: 2021-03-12
  Administered 2021-03-12: 19:00:00 1 mg via ORAL

## 2021-03-12 MED ORDER — amoxicillin-pot clavulanate (Augmentin) 875-125 mg tablet
875-125 | ORAL_TABLET | Freq: Two times a day (BID) | ORAL | 0 refills | 7.00000 days | Status: AC
Start: 2021-03-12 — End: 2021-03-17
  Filled 2021-03-12: qty 10, 5d supply, fill #0

## 2021-03-12 MED FILL — DOXYCYCLINE HYCLATE 100 MG TABLET: 100 100 mg | ORAL | Qty: 1

## 2021-03-12 MED FILL — AMOXICILLIN 875 MG-POTASSIUM CLAVULANATE 125 MG TABLET: 875-125 875-125 mg | ORAL | Qty: 1

## 2021-03-12 MED FILL — LORAZEPAM 1 MG TABLET: 1 1 mg | ORAL | Qty: 1

## 2021-03-12 MED FILL — DOXYCYCLINE HYCLATE 100 MG TABLET: 100 100 mg | ORAL | 5 days supply | Qty: 10 | Fill #0 | Status: CN

## 2021-03-12 NOTE — ED Provider Notes (Signed)
 [facility] EMERGENCY DEPARTMENT  Patient: [name]   MRN: [id number]   Date of visit: 03/12/2021   PCP: Amil Amen, MD     CHIEF COMPLAINT       Chief Complaint   Patient presents with   . Shortness of Breath       HPI     [name] is a 40 y.o. male with PMHx  has a past medical history of HIV (human immunodeficiency virus infection) (CMS/HCC). who presents to the ED for evaluation of multiple complaints including productive cough, night sweats, weight loss, fatigue, poor appetite for the past 2 weeks.  1 week ago patient was seen at [facility] emergency room and diagnosed with influenza A.  States that he has not felt improved since that time despite taking Tessalon Perles and decongestants .  He had a telehealth follow-up with his primary care 3 days ago and was prescribed a Z-Pak, he has taken 3 days and continues to feel unwell.  Today he was dropping his child off at school and had significant shortness of breath with walking around the building requiring him to stop and catch his breath.  At that time EMS was called.  Of note patient has intermittently been taking his Biktarvy in the past several weeks due to concern of "taking too many medications" in the setting of all the meds he is recently been prescribed.  He follows with his outpatient provider in [place] and was previously at undetectable HIV levels but he is unsure of his prior CD4 counts.    REVIEW OF SYSTEMS     Constitutional: Positive for fevers/chills, body aches, poor po intake, fatigue, weight loss, night sweats  Eyes: Negative for eye pain or acute changes in vision  ENT: Positive for sore throat.  Cardiovascular: Positive for palpitations. Negative for chest pain  Respiratory: Positive for cough and SOB  Gastrointestinal: Negative for abdominal pain, nausea, vomiting, diarrhea  GU: Negative for acute changes in urination  Skin: Negative for rash  MSK: Negative for focal pain or swelling  Neuro: Positive for headache,  dizziness,  exercise intolerence. Negative for focal numbness/tingling, focal weakness, syncope  Psych: Negative for ETOH, drug dependence.     PAST MEDICAL HISTORY         Past Medical History:   Diagnosis Date   . HIV (human immunodeficiency virus infection) (CMS/HCC)        MED LIST      Discharge Medication List as of 03/12/2021  2:27 PM      CONTINUE these medications which have NOT CHANGED    Details   Biktarvy 50-200-25 mg tablet Take 1 tablet by mouth in the morning., Starting Tue 07/15/2020, Historical Med      cetirizine (ZyrTEC) 10 mg tablet Take 1 tablet by mouth in the morning., Starting Wed 01/14/2021, Historical Med      fluticasone (Flonase) 50 mcg/actuation nasal spray 1 spray in the morning., Starting Wed 01/14/2021, Historical Med      sertraline (Zoloft) 25 mg tablet Take 25 mg by mouth in the morning., Starting Mon 07/21/2020, Historical Med      Ventolin HFA 90 mcg/actuation inhaler Inhale 2 puffs every 4 (four) hours if needed., Starting Wed 01/14/2021, Historical Med               FAMILY HISTORY       No family history on file.    SURGICAL HISTORY       History reviewed. No pertinent surgical  history.    ALLERGIES       is allergic to penicillins.    SOCIAL HISTORY       reports that he has never smoked. He has never used smokeless tobacco. He reports current drug use. Drug: Marijuana.    PHYSICAL EXAM        VITAL SIGNS:  Blood pressure 123/69, pulse 90, temperature 37.4 C (99.3 F), temperature source Oral, resp. rate 18, height 1.753 m, weight 77.1 kg, SpO2 97 %.     General: Patient is awake, alert, conversant and appears fatigued, uncomfortable, in no acute distress. Mildly tachycardic. Afebrile.  Head: Normocephalic atraumatic  Eyes: Pupils are equal and reactive to light, EOMs grossly intact  ENT: Speech is clear, voice is fluent, mucous membranes are moist  Neck: Supple, full ROM.  Cardiac: Sinus tachy, regular. no appreciable murmurs/rubs/gallops.  Pulmonary: Lungs are clear to  ausculation bilaterally, no increased WOB.  Abdomen: Soft, non-tender.   MSK: Full, normal active ROM of all four extremities with no reported discomfort.  Neuro: Awake, alert and oriented to person, place, time and situation. Cranial nerves grossely intact. Strength of the extremities intact. Sensation to light touch intact.   Skin: Warm, dry, well perfused  Psych: A&Ox4, pleasant, cooperative and conversant, somewhat anxious.    RESULTS        LABS  Labs Reviewed   C-REACTIVE PROTEIN - Abnormal       Result Value    CRP 42.28 (*)    COMPREHENSIVE METABOLIC PANEL - Abnormal    Sodium 138      Potassium 4.0      Chloride 104      CO2 (Bicarbonate) 26      Anion Gap 8      BUN 17      Creatinine 1.43 (*)     eGFRcr 63      Glucose 91      Fasting? Unknown      Calcium 9.7      AST 28      ALT 48      Alkaline phosphatase 73      Protein, total 7.9      Albumin 4.3      Bilirubin, total 0.6     SEDIMENTATION RATE, AUTOMATED - Abnormal    Sed Rate 106 (*)    CBC WITH DIFFERENTIAL - WAM AND NON-WAM - Abnormal    WBC 9.1      RBC 3.95 (*)     Hemoglobin 11.0 (*)     Hematocrit 34.7 (*)     MCV 87.8      MCH 27.8      MCHC 31.7      RDW-CV 12.9      RDW-SD 41.8      Platelets 390      MPV 9.9      Neutrophil % 64.0      Lymphocyte % 18.9      Monocytes % 16.3      Eosinophils % 0.1      Basophils % 0.1      Immature Granulocytes % 0.6      NRBC % 0.0      Neutrophils Absolute 5.81      Lymphocytes Absolute 1.71      Monocytes Absolute 1.48 (*)     Eosinophils Absolute 0.01      Basophils Absolute 0.01      Immature Granulocytes Absolute 0.05      NRBC  Absolute 0.00     CALCIUM, TOTAL - Normal    Calcium 9.7     PHOSPHORUS - Normal    Phosphorus 2.6     MONONUCLEOSIS SCREEN - Normal    Mononucleosis Screen Negative     THROAT CULTURE   GC/CT (AMP DNA)   CD4 PANEL    Narrative:     The following orders were created for panel order CD4 Panel.  Procedure                               Abnormality         Status                      ---------                               -----------         ------                     CD4 Panel[[id number]]                                        Final result               CBC w/ Differential[[id number]]          Abnormal            Final result                 Please view results for these tests on the individual orders.   CD4 PANEL (PERFORMABLE)    T Cell WBC 9.1      T Cell Lymphocyte % 19      T Cell Absolute Lymphocyte Count 1.7      % CD4 35.5      Absolute CD4 Count 0.614     TREPONEMA PALLIDUM (SYPHILIS) AB (RPR)   HIV-1 RNA, QUANTITATIVE, RT-PCR   RPR, QUANTITATIVE       RADIOLOGY  XR CHEST 2 VIEWS   Final Result   New ill-defined airspace opacity within the medial segment of the right middle lobe, concerning for pneumonia.      Follow-up imaging is recommended in 4-6 weeks' time following appropriate medical therapy to assess for complete resolution.      A ED Yellow alert was sent via the [facility] system for such notification and documentation to DEPT. EMERGENCY 03/12/2021 10:20 AM EST, message ID  [id number].      DICTATED: Florencia Reasons, DO   IN ACCORDANCE WITH DEPARTMENT POLICY, TEACHING PHYSICIANS REVIEW ALL IMAGES, AND EDIT REPORTS AS REQUIRED. A REPORT IS NOT FINAL UNTIL APPROVED BY A STAFF RADIOLOGIST.      APPROVED BY STAFF RADIOLOGIST: Luane School, MD 03/12/2021 10:37 AM EST           ED COURSE & MDM        Pertinent Labs & Imaging studies reviewed. (See chart for details)    ED Course as of 03/12/21 1622   Thu Mar 12, 2021   1024 ID fellow tiger texted for consult [NP]   1202 XR CHEST 2 VIEWS  CXR independently visualized and interpreted by me: normal mediastinum and cardiac silhouettes, no large pleural effusions, increased  consolidation in the right middle lobe consistent with pneumonia.  [WG]   1246 Normal white count 9.1.  Hemoglobin slightly low 11.  Creatinine mildly elevated 1.43.  CRP and ESR moderately elevated, 42 and 106 respectively. [JR]   7478 40 year old  male with history of HIV typically on HAART presents with an ongoing cough and some shortness of breath.  In the ED he was initially mildly tachycardic, he is anxious.  Heart rate 99 on my exam with pulse ox 97% and clear lungs.  Chest x-ray suggestive of right middle lobe pneumonia.  He currently is on azithromycin day #2 and I have recommended he complete his course with the addition of Augmentin for community-acquired pneumonia.  Will ensure patient is able to do an ambulatory sat without any desaturation as I believe he should be appropriate for outpatient management. [JR]      ED Course User Index  [JR] Norva Riffle, MD  [NP] Jeronimo Norma, PA  [WG] Christie Nottingham, EMT         Diagnoses as of 03/12/21 1622   Lobar pneumonia (CMS/HCC)       MDM  Patient presents for ongoing cough, sore throat, feelings of malaise fatigue exercise intolerance chills sweats in the setting of testing positive for influenza A 1 week ago.  States that his symptoms have not improved despite 3 days of his Z-Pak prescribed by his PCP.  Today he had significant exercise intolerance felt lightheaded and shortness of breath with ambulation leading to EMS being called.    His vitals on arrival are stable, mildly tachycardic.  Lungs are clear to auscultation.  Labs show an elevated ESR and CRP though no leukocytosis.  His chest x-ray is shows a right middle lobe consolidation concerning for superimposed pneumonia.     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  He confirms to me that he experience no hives or other allergic reaction symptoms after the IM penicillin injection.    As such infectious disease recommends that we discontinue patient's Z-Pak and  start 5-day course of Augmentin and doxycycline for his pneumonia.    Patient had requested syphilis testing for possible repeat exposure while in the emergency room.  I did discuss with him that due to his history of positive test in the past he will require reflex testing which may take several days to result.  Patient is aware and agreeable to proceed with testing.  He also requests that we send his HIV viral load as this has not been checked for some time.  This was sent per patient's request but has not yet resulted at time of patient's discharge.  Patient was informed verbally and in writing that this test is still pending.  He states that his outpatient ID provider in [place] has access to [facility] records and he plans to follow-up with her within the week for reevaluation and to check on his lab results at that time.    Discussed with patient that we recommend giving his new antibiotic regimen 48 hours to take full effect and we expect him to start feeling better at that time.  However we did encourage the patient to return to the emergency room for any acute changes or worsening of his symptoms at  any time    He tells me that he will message his outpatient provider today through his MyChart and requested an appointment for follow-up    CLINICAL IMPRESSION  Right middle lobe pneumonia  [facility]  Request for STI testing           Jeronimo Norma, PA  03/13/21 1508

## 2021-03-12 NOTE — ED Notes (Signed)
 Avg 95% on ambulatory sat. PA aware.      Ocie Cornfield, RN  03/12/21 [phone]

## 2021-03-12 NOTE — Discharge Instructions (Addendum)
 You were evaluated in [facility] emergency room for persistent coughing, night sweats, chills and fatigue, in the setting of diagnosis of the flu over a week ago.   As discussed your chest x-ray today shows a consolidation in the right middle lobe concerning for lobar pneumonia (bacterial infection)  We have consulted the infectious disease team at [facility] who recommend that you discontinue your Z-Pak, and start Augmentin twice daily as well as doxycycline twice daily.  You received the first dose of both of these medications in the emergency room today.  Your first home dose will be today in the evening 03/12/2021 with a small meal.  Continue for total of 5 days of therapy, do not discontinue early even if you are feeling improved    As discussed today your labs showed no elevation in your white blood cell count,  your hemoglobin was 11.0 your hematocrit was 34.7.  Your complete metabolic panel was within normal limits it did show a very mildly elevated creatinine of 1.43 likely in the setting of dehydration.  He received IV fluid hydration in the emergency room and we recommend that you to have this rechecked by your primary care provider on an outpatient basis.     As discussed your HIV viral  load results as well as your syphilis testing is still pending at time of discharge, As well as your gonorrhea/chlamydia testing.    You will receive a phone call from [facility] if your syphilis or gonorrhea chlamydia testing is positive and at that time further treatment recommendations will be discussed     we strongly urged you to follow-up with your outpatient provider as discussed within the week to follow-up on your HIV viral load numbers, to recheck how you are feeling and to ensure that you are progressing adequately  You may use Tylenol and/or Motrin for mild to moderate discomfort if needed  Return to the emergency room for any acutely worsening symptoms including high fevers difficulty breathing or if you do  not feel improved after 48 hours of antibiotic therapy

## 2021-03-12 NOTE — ED Notes (Signed)
 Patient reports sob amd dcreased o2 this morning. Recently flu + hx of HIV. Well appearing in room saturation WNL at 97%     Ocie Cornfield, RN  03/12/21 1018

## 2021-03-12 NOTE — ED Triage Notes (Signed)
 The patient reports 2 weeks of SOB.  Reportedly two weeks ago, treated for Flu and "bronchiole infection".  Reportedly continues with SOB, and cough.  He is awake, alert. His airway in patent.  Hx HIV.

## 2021-03-12 NOTE — Consults (Signed)
 [facility]   Division of Geographic Medicine and Infectious Diseases    General ID Initial Consult Note - Blue Team  Date: 03/12/2021    Name: [name]  MRN: [id number]  DOB: Aug 22, 1980    Reason for consult: HIV, potential STI, documented penicillin allergy  Attending physician asking for consult: No att. providers found  Primary: Emergency Department    History of Present Illness  [name] is a 40 y.o. male with a history of HIV on Bictegravir/emtricitabine/tenofovir, asthma, and anxiety who presented to the [facility] ED on 12/22 for evaluation of dyspnea.  He has had 2 weeks of a persistent productive cough and shortness of breath.  This was originally associated with fevers and chills, [name] that the fevers have resolved but persistently has night sweats.  This is additionally associated with decreased appetite weight loss.  He originally was seen on 12/14 at that time was found to be influenza A positive and to have a largely unremarkable chest x-ray.  Given persistence of symptoms about 3 days ago his PCP also provided him with a course of azithromycin.    [name] that he felt worsening shortness of breath and palpitations when dropping his nephew off at school.  A staff member at the school who had a O2 monitor checked his oxygen and it was in the low 90s and he was tachycardic.  This prompted him to present to the ED.  While at the ED he was also appreciate STI screening.  Believes his partner may have had unprotected sex with other partners recently.  He has not noted any new lesions or discharge.    Recent Antimicrobials  Current:  Azithromycin     Prior:  None    Review of Systems  Review of Systems   Constitutional: Positive for chills, diaphoresis, fever, malaise/fatigue and weight loss.   HENT: Negative for hearing loss.    Eyes: Negative for blurred vision and double vision.   Respiratory: Positive for cough, sputum production and shortness of breath.    Cardiovascular:  Positive for palpitations. Negative for chest pain.   Gastrointestinal: Negative for diarrhea, nausea and vomiting.   Genitourinary: Negative for dysuria.   Skin: Negative for rash.   Neurological: Positive for dizziness. Negative for focal weakness and loss of consciousness.     Allergies  Penicillins    Past Medical History  He has a past medical history of HIV (human immunodeficiency virus infection) (CMS/HCC).    Surgical History  He has no past surgical history on file.     Social History  [name] that he has never smoked. He has never used smokeless tobacco. [name] current drug use. Drug: Marijuana. No history on file for alcohol use.    Family History   No family history on file.    Physical Exam   BP 123/69 (BP Location: Left arm, Patient Position: Sitting)   Pulse 90   Temp 37.4 C (99.3 F) (Oral)   Resp 18   Ht 1.753 m   Wt 77.1 kg   SpO2 97%   BMI 25.10 kg/m   Physical Exam  Constitutional:       General: He is not in acute distress.     Appearance: He is not diaphoretic.   HENT:      Head: Normocephalic and atraumatic.      Mouth/Throat:      Mouth: Mucous membranes are moist.      Pharynx: No oropharyngeal exudate.  Eyes:      General: No scleral icterus.  Cardiovascular:      Rate and Rhythm: Regular rhythm. Tachycardia present.      Heart sounds: Normal heart sounds.   Pulmonary:      Effort: Pulmonary effort is normal.      Breath sounds: Normal breath sounds.   Abdominal:      General: There is no distension.      Palpations: Abdomen is soft. There is no mass.      Tenderness: There is no abdominal tenderness.   Genitourinary:     Penis: Normal.       Testes: Normal.      Comments: No inguinal lymphadenopathy  Musculoskeletal:      Cervical back: Normal range of motion. No rigidity.   Skin:     General: Skin is warm and dry.      Findings: No rash.   Neurological:      General: No focal deficit present.      Mental Status: He is alert and oriented to person, place, and time.          Patient Lines/Drains/Airways Status     Active .     None              Labs  WBC Trend  Lab Results   Component Value Date    WBC 9.1 03/12/2021    NEUTROAUTO 64.0 03/12/2021     CBC  Lab Results   Component Value Date    WBC 9.1 03/12/2021    HCT 34.7 (L) 03/12/2021    HGB 11.0 (L) 03/12/2021    PLT 390 03/12/2021      BMP  Lab Results   Component Value Date    NA 138 03/12/2021    K 4.0 03/12/2021    CL 104 03/12/2021    CO2 26 03/12/2021    BUN 17 03/12/2021    CREATININE 1.43 (H) 03/12/2021      LFTS  Lab Results   Component Value Date    ALT 48 03/12/2021    AST 28 03/12/2021    BILITOT 0.6 03/12/2021    PROT 7.9 03/12/2021    ALBUMIN 4.3 03/12/2021     Micro Data:  Blood Cultures  No results found for: BLOODCX  Respiratory Cultures  No results found for: CULTURE  Urine Cultures  No results found for: URINECX  Tissue/Wound Cultures:  No results found for: CULTURE, [facility]    Radiology:  CXR PA and Lateral 12/22:  New ill-defined airspace opacity within the medial segment of the right middle lobe, concerning for pneumonia.    Assessment   [name] is a 40 y.o. male with a history of HIV on Bictegravir/emtricitabine/tenofovir, asthma, and anxiety who presented to the [facility] ED on 12/22 for evaluation of dyspnea.      Influenza A Infection  Super-imposed Bacterial Pneumonia  - new RML infiltrate on CXR (small developing infiltrate on 12/14)  - recommend course for community acquired pneumonia would treat with 5 days of amoxicillin/clavulante and doxycycline.    Documentation of Penicillin Allergy  - patient Sparks that he says that he has a penicillin allergy because his motther always told him that she had an allergy and he should not get penicillin.  - he does not recall any reaction that he has had to an antibiotic  - encounter from 09/20/2019 documents administration of penicillin G.  Patient does remember intramuscular injection at that time and no complications.  -  do not believe this represents a  true allergy    HIV  - on Bictegravir/emtricitabine/tenofovir, denies issue with access, normally adherent but has struggled while sick. Counseled on importance.  - established with outside HIV provider, patient plans to follow up  - STI screening pending at time of eval  - viral load undetectable on 01/06/21    This patient's care was discussed with my attending Dr. Abbe Amsterdam, and recommendations were discussed with the primary team.     Thank you for involving Infectious Diseases -Blue Team in the care of this patient.  I will continue to follow along, and can be reached by [employer]	[place] with any questions or concerns.    Mcneil Sober, MD  PGY4, Infectious Disease  [employer]

## 2021-03-12 NOTE — ED Provider Notes (Signed)
 ATTENDING NOTE    Date of Service:03/12/2021  9:00 AM   Chief Complaint   Patient presents with   . Shortness of Breath       Patient seen by the advanced practice provider under my supervision. I agree with the assessment and evaluation/work up. I personally saw the patient and performed a substantial portion of the visit including all aspects of the medical decision making.    History of Present Illness  Electronic Medical Records Reviewed: Yes  Medical Interpreter Used: No  History Reviewed and Discussed with PA    40 yo male patient with PMHx of HIV on bictarvey and syphilis (Nov 2021) presents to the ED for evaluation of SOB. Patient states when he last had blood work drawn for his HIV, his viral load was undetectable. He c/o 2 wks of persistent cough, SOB, decreased appetite, chills at night, unintentional weight loss. Patient states his symptoms have not improved since he was last seen in this ED on 03/04/2021, when he was diagnosed with flu, and when he is coughing and short of breath he feels as though he might pass out. He states his cough is productive of a creamy phlegm. Patient was prescribed a Z-pak 3 days PTA by his PCP with no improvement. Patient has not taken his bictarvey intermittently for the last 2 weeks. Patient also requesting repeat STI testing for syphilis. Patient reports he has no interest in sex, and denies any genital lesions. Pt also endorses smoking 10 joints of marijuana on a normal day.     History/Exam limitations: none.    Past Medical/Surgical History    Past Medical History:   Diagnosis Date   . HIV (human immunodeficiency virus infection) (CMS/HCC)      There is no problem list on file for this patient.    History reviewed. No pertinent surgical history.    Medications    Previous Medications    BIKTARVY 50-200-25 MG TABLET    Take 1 tablet by mouth in the morning.    CETIRIZINE (ZYRTEC) 10 MG TABLET    Take 1 tablet by mouth in the morning.    FLUTICASONE (FLONASE) 50  MCG/ACTUATION NASAL SPRAY    1 spray in the morning.    SERTRALINE (ZOLOFT) 25 MG TABLET    Take 25 mg by mouth in the morning.    VENTOLIN HFA 90 MCG/ACTUATION INHALER    Inhale 2 puffs every 4 (four) hours if needed.        Allergies    Allergies   Allergen Reactions   . Penicillins Unknown     Unknown, just knows he can't take it  Mother told him, he believes he may have had it without issue  Other reaction(s): Unknown  Unknown, just knows he can't take it  Mother told him, he believes he may have had it without issue         Social History    Social History     Tobacco Use   . Smoking status: Never   . Smokeless tobacco: Never   Substance Use Topics   . Alcohol use: Not on file     Social History     Substance and Sexual Activity   Drug Use Yes   . Types: Marijuana       Family History    No family history on file.    Review of Systems     Review of Systems   Constitutional: Positive for appetite change, chills and  unexpected weight change. Negative for fever.   HENT: Negative for ear pain and sore throat.    Eyes: Negative for pain and visual disturbance.   Respiratory: Positive for cough, chest tightness and shortness of breath.    Cardiovascular: Positive for chest pain. Negative for palpitations.   Gastrointestinal: Negative for abdominal pain and vomiting.   Genitourinary: Negative for dysuria, genital sores and hematuria.   Musculoskeletal: Negative for back pain.   Skin: Negative for rash.   Neurological: Negative for dizziness and headaches.   Psychiatric/Behavioral: The patient is nervous/anxious.    All other systems reviewed and are negative.      Physical Exam  ED Triage Vitals [03/12/21 0857]   Temp Pulse Resp BP   37.4 C (99.3 F) 114 24 136/82      SpO2 Temp Source Heart Rate Source Patient Position   97 % Oral -- --      BP Location FiO2 (%)     -- --        General: Well-appearing. NAD. Speaking full sentences, pulse ox 93% transiently and up to 97% without intervention, HR 99.   Head:  NC/AT.  Eyes: PERRL, clear conjunctivae.  ENT: Moist oral mucosa. Normal posterior OP.   Neck: Supple, trachea midline, no cervical LAD.   Respiratory/Chest: CTAB. No accessory muscle use.   CVS: mildly tachycardic, normal S1 and S2, no murmurs, no chest wall tenderness.   Abdomen: Soft, non-tender, non-distended.  Back: No MLT, no CVAT.  Extremities: LE symmetric, no edema.  Skin: Normal temperature, brisk capillary refill, no rash.  Neuro: Alert, mentation appropriate, grossly symmetric cranial nerves, moves all extremities, with no focal deficits.       Labs/Imaging  Labs Reviewed   C-REACTIVE PROTEIN - Abnormal       Result Value    CRP 42.28 (*)    COMPREHENSIVE METABOLIC PANEL - Abnormal    Sodium 138      Potassium 4.0      Chloride 104      CO2 (Bicarbonate) 26      Anion Gap 8      BUN 17      Creatinine 1.43 (*)     eGFRcr 63      Glucose 91      Fasting? Unknown      Calcium 9.7      AST 28      ALT 48      Alkaline phosphatase 73      Protein, total 7.9      Albumin 4.3      Bilirubin, total 0.6     SEDIMENTATION RATE, AUTOMATED - Abnormal    Sed Rate 106 (*)    CBC WITH DIFFERENTIAL - WAM AND NON-WAM - Abnormal    WBC 9.1      RBC 3.95 (*)     Hemoglobin 11.0 (*)     Hematocrit 34.7 (*)     MCV 87.8      MCH 27.8      MCHC 31.7      RDW-CV 12.9      RDW-SD 41.8      Platelets 390      MPV 9.9      Neutrophil % 64.0      Lymphocyte % 18.9      Monocytes % 16.3      Eosinophils % 0.1      Basophils % 0.1      Immature Granulocytes % 0.6  NRBC % 0.0      Neutrophils Absolute 5.81      Lymphocytes Absolute 1.71      Monocytes Absolute 1.48 (*)     Eosinophils Absolute 0.01      Basophils Absolute 0.01      Immature Granulocytes Absolute 0.05      NRBC Absolute 0.00     CALCIUM, TOTAL - Normal    Calcium 9.7     PHOSPHORUS - Normal    Phosphorus 2.6     MONONUCLEOSIS SCREEN - Normal    Mononucleosis Screen Negative     THROAT CULTURE   GC/CT (AMP DNA)   TREPONEMA PALLIDUM (SYPHILIS) AB (RPR)   HIV-1 RNA,  QUANTITATIVE, RT-PCR   CD4 PANEL    Narrative:     The following orders were created for panel order CD4 Panel.  Procedure                               Abnormality         Status                     ---------                               -----------         ------                     CD4 Panel[[id number]]                                        In process                 CBC w/ Differential[[id number]]          Abnormal            Final result                 Please view results for these tests on the individual orders.   CD4 PANEL (PERFORMABLE)   RPR, QUANTITATIVE      XR CHEST 2 VIEWS   Final Result   New ill-defined airspace opacity within the medial segment of the right middle lobe, concerning for pneumonia.      Follow-up imaging is recommended in 4-6 weeks' time following appropriate medical therapy to assess for complete resolution.      A ED Yellow alert was sent via the [facility] system for such notification and documentation to DEPT. EMERGENCY 03/12/2021 10:20 AM EST, message ID  [id number].      DICTATED: Florencia Reasons, DO   IN ACCORDANCE WITH DEPARTMENT POLICY, TEACHING PHYSICIANS REVIEW ALL IMAGES, AND EDIT REPORTS AS REQUIRED. A REPORT IS NOT FINAL UNTIL APPROVED BY A STAFF RADIOLOGIST.      APPROVED BY STAFF RADIOLOGIST: Luane School, MD 03/12/2021 10:37 AM EST          ED Course  ED Course as of 03/12/21 1444   Thu Mar 12, 2021   1024 ID fellow tiger texted for consult [NP]   1202 XR CHEST 2 VIEWS  CXR independently visualized and interpreted by me: normal mediastinum and cardiac silhouettes, no large pleural effusions, increased consolidation in the right middle lobe consistent with pneumonia.  [WG]  1246 Normal white count 9.1.  Hemoglobin slightly low 11.  Creatinine mildly elevated 1.43.  CRP and ESR moderately elevated, 42 and 106 respectively. [JR]   2969 40 year old male with history of HIV typically on HAART presents with an ongoing cough and some shortness of breath.  In the  ED he was initially mildly tachycardic, he is anxious.  Heart rate 99 on my exam with pulse ox 97% and clear lungs.  Chest x-ray suggestive of right middle lobe pneumonia.  He currently is on azithromycin day #2 and I have recommended he complete his course with the addition of Augmentin for community-acquired pneumonia.  Will ensure patient is able to do an ambulatory sat without any desaturation as I believe he should be appropriate for outpatient management. [JR]      ED Course User Index  [JR] Norva Riffle, MD  [NP] Jeronimo Norma, PA  [WG] Christie Nottingham, EMT         Diagnoses as of 03/12/21 1444   Lobar pneumonia (CMS/HCC)     Final Disposition: Discharged    Clinical  Impression  1. Lobar pneumonia (CMS/HCC)        By signing my name below, I, Winifred Gallogly, attest that this documentation has been prepared under the direction and presence of Dr. Drue Stager, MD.     Electronically signed: Christie Nottingham, Scribe. 03/12/21 2:44 PM.    Consent to use a scribe was obtained prior to the start of the encounter by clinical staff.    Medical scribe attestation:     I have personally performed the services described in this documentation, as written by my scribe above in my presence.  It is both accurate and complete when signed by me.  I have performed any pertinent edits.           Norva Riffle, MD  03/12/21 [phone]

## 2021-03-13 ENCOUNTER — Encounter (HOSPITAL_BASED_OUTPATIENT_CLINIC_OR_DEPARTMENT_OTHER): Admitting: Emergency Medicine

## 2021-03-13 ENCOUNTER — Inpatient Hospital Stay
Admit: 2021-03-13 | Discharge: 2021-03-13 | Disposition: A | Discharge status: Home / Self Care | Payer: MEDICAID | Attending: Emergency Medicine

## 2021-03-13 DIAGNOSIS — A539 Syphilis, unspecified: Secondary | ICD-10-CM

## 2021-03-13 LAB — HIV-1 RNA, QUANTITATIVE, RT-PCR: HIV-1 RNA Viral Load, Interp: NOT DETECTED

## 2021-03-13 LAB — GC/CT (AMP DNA)
CT DNA: NOT DETECTED
GC DNA: NOT DETECTED

## 2021-03-13 LAB — THROAT CULTURE: Throat Culture: NEGATIVE

## 2021-03-13 MED ORDER — penicillin G benzathine (Bicillin-LA) injection 2.4 Million Units
2400000 | Freq: Once | INTRAMUSCULAR | Status: AC
Start: 2021-03-13 — End: 2021-03-13
  Administered 2021-03-13: 19:00:00 2.4 10*6.[IU] via INTRAMUSCULAR

## 2021-03-13 MED FILL — PENICILLIN G BENZATHINE 2,400,000 UNIT/4 ML INTRAMUSCULAR SYRINGE: 2400000 2,400,000 unit/4 mL | INTRAMUSCULAR | Qty: 4

## 2021-03-13 NOTE — Discharge Instructions (Addendum)
 Follow up with your infectious disease doctor who manages your [place] and let him/her know you got you the shot of penicillin.  Complete the full course of antibiotics for your pneumonia and continue to take your Biktarvy.  If you get worse or develop any of the warning signs on the accompanying information sheet, return immediately to the emergency department

## 2021-03-13 NOTE — ED Provider Notes (Signed)
 HPI   Chief Complaint   Patient presents with   . Shortness of Breath       The patient is a 40 year old male with past medical history of HIV on Biktarvy, prior syphilis infection last treated in 09/2019, recent diagnosis of influenza A with right middle lobe pneumonia who presents to the emergency department after his T pallidum screen was positive yesterday. He notes he has had cough and malaise in the setting of his recent pneumonia. He has been taking his prescribed Augmentin and doxycycline for his pneumonia.      History provided by:  Patient                Glasgow Coma Scale Score: 15                                  Patient History   Past Medical History:   Diagnosis Date   . HIV (human immunodeficiency virus infection) (CMS/HCC)      History reviewed. No pertinent surgical history.  No family history on file.  Social History     Tobacco Use   . Smoking status: Never   . Smokeless tobacco: Never   Substance Use Topics   . Alcohol use: Not on file   . Drug use: Yes     Types: Marijuana       Review of Systems   Review of Systems    Physical Exam   ED Triage Vitals   Temp Pulse Resp BP   03/13/21 1215 03/13/21 1215 03/13/21 1215 03/13/21 1215   37.1 C (98.8 F) 100 20 (!) 142/80      SpO2 Temp src Heart Rate Source Patient Position   03/13/21 1218 -- -- --   99 %         BP Location FiO2 (%)     -- --             Physical Exam  Vitals and nursing note reviewed.   Constitutional:       General: He is not in acute distress.     Appearance: He is well-developed.   HENT:      Head: Normocephalic and atraumatic.   Eyes:      Conjunctiva/sclera: Conjunctivae normal.   Cardiovascular:      Rate and Rhythm: Normal rate and regular rhythm.      Heart sounds: No murmur heard.  Pulmonary:      Effort: Pulmonary effort is normal. No respiratory distress.      Breath sounds: Examination of the right-middle field reveals rhonchi. Rhonchi present. No decreased breath sounds or wheezing.   Abdominal:      Palpations: Abdomen  is soft.      Tenderness: There is no abdominal tenderness.   Musculoskeletal:         General: No swelling.      Cervical back: Neck supple.   Skin:     General: Skin is warm and dry.      Capillary Refill: Capillary refill takes less than 2 seconds.   Neurological:      General: No focal deficit present.      Mental Status: He is alert and oriented to person, place, and time.   Psychiatric:         Mood and Affect: Mood normal.       No orders to display       Labs Reviewed -  No data to display    ED Course & MDM   Diagnoses as of 03/13/21 1332   Syphilis       Medical Decision Making  Patient presents with positive T pallidum antibiotics.  Patient was treated for syphilis in 09/2019 and had a positive screen in 11/21 and cannot recall if he received treatment at that time.  Will treat empirically with IM penicillin. Spoke with ID who had a full consult yesterday.  Patient will continue antibiotics for his known right middle lobe pneumonia.  Patient will follow up with his ID specialist who manages his HIV.  Patient has allergy to penicillin per Epic but has tolerated it in the past.  Patient discharged with follow up with his ID physician and return precautions.             Vicente Masson, MD  03/13/21 [phone]

## 2021-03-13 NOTE — ED Triage Notes (Addendum)
 Pt arrives via EMS with SOB. Pt reports that he has had SOB for a few weeks which worsened upon being told he was +syphillis and needed treatment. Pt diagnosed with pneumonia yesterday and started antibiotics.

## 2021-03-13 NOTE — ED Notes (Signed)
 ED radiology yellow alert regarding CXR received by ED follow up nurse.ED provider/pt aware of results. Pt diagnosed with PNA and diagnosed on antibiotics.     Rudell Cobb, RN  03/13/21 [phone]

## 2021-03-18 LAB — RPR, QUANTITATIVE: RPR, Quantitative: NONREACTIVE

## 2021-03-18 LAB — TP PA: TP PA: REACTIVE — AB

## 2021-06-17 LAB — UNMAPPED LAB RESULTS
Chlamydia Trachomatis, Urine (EXT): NOT DETECTED
Neisseria Gonorrhoeae, Urine (EXT): NOT DETECTED

## 2021-06-18 LAB — CHLAMYDIA/GC (EXT)
Chlamydia Trachomatis,Rectal (EXT): NOT DETECTED
Chlamydia trachomatis, Throat (EXT): NOT DETECTED
Neisseria gonorrhoeae, Rectal (EXT): NOT DETECTED
Neisseria gonorrhoeae, Throat (EXT): NOT DETECTED

## 2021-08-10 LAB — UNMAPPED LAB RESULTS
Chlamydia Trachomatis, Urine (EXT): NOT DETECTED
Neisseria Gonorrhoeae, Urine (EXT): DETECTED — AB

## 2021-08-10 LAB — CHLAMYDIA/GC (EXT)
Chlamydia Trachomatis,Rectal (EXT): NOT DETECTED
Neisseria gonorrhoeae, Rectal (EXT): DETECTED — AB

## 2021-08-14 LAB — HEP C AB WITH REFLEX QUANT (EXT)
HEP C Ab. S/CO RATIO (EXT): 0.08 (ref <1.00)
HEPATITIS C ANTIBODY (EXT): NONREACTIVE

## 2021-09-28 ENCOUNTER — Inpatient Hospital Stay
Admit: 2021-09-28 | Discharge: 2021-09-28 | Disposition: A | Discharge status: Home / Self Care | Payer: MEDICAID | Attending: Emergency Medicine

## 2021-09-28 DIAGNOSIS — J029 Acute pharyngitis, unspecified: Secondary | ICD-10-CM

## 2021-09-28 NOTE — Discharge Instructions (Addendum)
You were seen in the ED today for evaluation of acute episode of shortness of breath in the setting of 2-days of cough, clear phlegm, and sore throat. Your clinical exam was benign, in that your lungs were clear, you were saturating well on room air (SpO2 96%) and you did not appear clinically short of breath during the interview. At your request, we are repeating a COVID test and will call you with the results, if positive. You are clear for discharge but advised to return to the ED (or your primary care physician) if symptoms return, worsen, or do not improve with use of your inhaler.

## 2021-09-28 NOTE — ED Triage Notes (Signed)
BIBA from T station reports cough X 2 days used inhaler but still sob

## 2021-09-28 NOTE — ED Provider Notes (Signed)
EMERGENCY DEPARTMENT ENCOUNTER    ATTENDING NOTE    Date of service: 09/28/2021  1:43 PM    No chief complaint on file.      Nursing notes reviewed and agreed with.    Electronic Medical Records Reviewed: Yes  Medical Interpreter Used: No  History Reviewed and Discussed with Resident    HISTORY OF PRESENT ILLNESS:    [name] is a 41 y.o. male who presents with a productive cough that has persisted for 2 days. He describes his sputum as clear. He reports that he was walking his dog this morning when he experienced SOB. He typically walks his dog with no trouble breathing. He is unsure whether the SOB was medical or anxiety-induced. Pt has an inhaler but does not have asthma, however, he expressed improvement  s/p using his inhaler. He denies any recent travel and no hx of blood clot or PE risks.    Review of Systems   Constitutional: Negative for chills, fever and malaise/fatigue.   Respiratory: Positive for sputum production and shortness of breath. Negative for cough.    Cardiovascular: Negative for chest pain.   Gastrointestinal: Negative for diarrhea, nausea and vomiting.   Neurological: Positive for dizziness. Negative for weakness and headaches.   Psychiatric/Behavioral: The patient is nervous/anxious.        PAST MEDICAL HISTORY / PROBLEM LIST    Past Medical History:   Diagnosis Date   . Anxiety    . HIV (human immunodeficiency virus infection) (CMS/HCC)        PAST SURGICAL HISTORY    History reviewed. No pertinent surgical history.    MEDICATIONS     Discharge Medication List as of 09/28/2021  3:31 PM      CONTINUE these medications which have NOT CHANGED    Details   Biktarvy 50-200-25 mg tablet Take 1 tablet by mouth in the morning., Starting Tue 07/15/2020, Historical Med      cetirizine (ZyrTEC) 10 mg tablet Take 1 tablet by mouth in the morning., Starting Wed 01/14/2021, Historical Med      fluticasone (Flonase) 50 mcg/actuation nasal spray 1 spray in the morning., Starting Wed 01/14/2021,  Historical Med      sertraline (Zoloft) 25 mg tablet Take 25 mg by mouth in the morning., Starting Mon 07/21/2020, Historical Med      Ventolin HFA 90 mcg/actuation inhaler Inhale 2 puffs every 4 (four) hours if needed., Starting Wed 01/14/2021, Historical Med              ALLERGIES    Allergies   Allergen Reactions   . Penicillins Unknown     Unknown, just knows he can't take it  Mother told him, he believes he may have had it without issue  Other reaction(s): Unknown  Unknown, just knows he can't take it  Mother told him, he believes he may have had it without issue         SOCIAL HISTORY    Social History     Tobacco Use   . Smoking status: Never   . Smokeless tobacco: Never   Substance Use Topics   . Alcohol use: Not on file     Social History     Substance and Sexual Activity   Drug Use Yes   . Types: Marijuana       FAMILY HISTORY    No family history on file.    PHYSICAL EXAM  TRIAGE (FIRST SET) VITAL SIGNS  Temp: 37.2 C (98.9 F)  Oral  Pulse: 99  BP: 115/73  Resp: 16  SpO2: 96 % Oxygen Therapy: None (Room air)  Glasgow Coma Scale Score: 15    Physical Exam  Vitals and nursing note reviewed.   Constitutional:       General: He is not in acute distress.     Appearance: He is well-developed.   HENT:      Head: Normocephalic and atraumatic.      Mouth/Throat:      Mouth: Mucous membranes are moist.   Eyes:      Extraocular Movements: Extraocular movements intact.      Conjunctiva/sclera: Conjunctivae normal.      Pupils: Pupils are equal, round, and reactive to light.   Cardiovascular:      Rate and Rhythm: Normal rate and regular rhythm.      Pulses: Normal pulses.      Heart sounds: Normal heart sounds. No murmur heard.  Pulmonary:      Effort: Pulmonary effort is normal. No respiratory distress.      Breath sounds: Normal breath sounds.   Abdominal:      Palpations: Abdomen is soft.      Tenderness: There is no abdominal tenderness.   Musculoskeletal:         General: No swelling.      Cervical back:  Normal range of motion and neck supple.   Skin:     General: Skin is warm and dry.      Capillary Refill: Capillary refill takes less than 2 seconds.   Neurological:      Mental Status: He is alert.   Psychiatric:         Mood and Affect: Mood normal.       LABS    Labs Reviewed   COVID-19 AND OTHER RESPIRATORY VIRAL PCR TESTING    Narrative:     The following orders were created for panel order COVID-19 and Other Respiratory Viral PCR Tests.  Procedure                               Abnormality         Status                     ---------                               -----------         ------                     SARS-CoV-2 and Influenza.Marland Kitchen.[[id number]]                                                   Please view results for these tests on the individual orders.   SARS-COV-2 AND INFLUENZA A/B RNA BY PCR (CAT3)        MEDICAL DECISION MAKING & ED COURSE  41 y/o male presents for evaluation of anxiety and SOB. Pt's physical exam was normal.  As per pt request, COVID test obtained and pt will be called if it returns positive. Pt given good return instructions and recommended to follow up with PCP for further care and management. Pt happy with overall  care and agreeable to discharge plan.     CLINICAL IMPRESSION:    1. Viral syndrome        By signing my name below, I, Kathyrn Sheriff, attest that this documentation has been prepared under the direction and presence of Dr. Gaylyn Rong, MD.     Electronically signed: Kathyrn Sheriff, Scribe. 10/01/21 5:08 PM    Consent to use a scribe was obtained prior to the start of the encounter by clinical staff.      I have personally performed the services described in this documentation, as written by my scribe above in my presence. It is both accurate and complete when signed by me. I have performed any pertinent edits.       Cammy Copa, MD  10/01/21 1709

## 2021-09-28 NOTE — ED Notes (Signed)
Pt eloped from hallway prior to receiving covid test. After being seen by MD Ezzard Standing and [address]       Keane Scrape, RN  09/28/21 1544

## 2021-09-28 NOTE — Unmapped (Signed)
HPI   No chief complaint on file.      HPI  41yo M with PMHx HIV (on Biktarvy, undetectable viral load) and postnasal drip presents to ED with 2-day history of sore throat, cough with clear phlegm, and SOB that acutely worsened this afternoon while riding the T. Patient reports he was walking dog earlier with mild symptoms but he felt acutely anxious while on the T, feeling short of breath but denies any associated chest pain/palpitations, visual changes, or syncope. He used his albuterol inhaler with moderate resolution of symptoms and proceeded to present to the ED for evaluation. He also took an at-home COVID test yesterday which was negative.     Of note, he reports he has had episodes of anxiety/subjective feelings of SOB in the past, since his husband passed away from esophageal disease a few years ago.               No data recorded                       PERC Calculation: 0          Patient History   Past Medical History:   Diagnosis Date   . Anxiety    . HIV (human immunodeficiency virus infection) (CMS/HCC)      History reviewed. No pertinent surgical history.  No family history on file.  Social History     Tobacco Use   . Smoking status: Never   . Smokeless tobacco: Never   Substance Use Topics   . Alcohol use: Not on file   . Drug use: Yes     Types: Marijuana       Review of Systems   Review of Systems   Constitutional: Negative for activity change, diaphoresis, fatigue and fever.   HENT: Positive for postnasal drip and sore throat.    Respiratory: Positive for cough and shortness of breath. Negative for chest tightness and wheezing.    Cardiovascular: Negative.    Gastrointestinal: Negative.    Endocrine: Negative.    Genitourinary: Negative.    Musculoskeletal: Negative.    Neurological: Negative for syncope and headaches.   Psychiatric/Behavioral: Negative.        Physical Exam   ED Triage Vitals [09/28/21 1343]   Temp Pulse Resp BP   37.2 C (98.9 F) 99 16 115/73      SpO2 Temp Source Heart Rate  Source Patient Position   96 % Oral -- --      BP Location FiO2 (%)     -- --       Physical Exam  Constitutional:       Appearance: Normal appearance.   HENT:      Head: Normocephalic and atraumatic.      Nose: No rhinorrhea.      Mouth/Throat:      Pharynx: No oropharyngeal exudate or posterior oropharyngeal erythema.   Eyes:      Extraocular Movements: Extraocular movements intact.      Pupils: Pupils are equal, round, and reactive to light.   Cardiovascular:      Rate and Rhythm: Normal rate and regular rhythm.      Pulses: Normal pulses.   Pulmonary:      Effort: Pulmonary effort is normal.      Breath sounds: Normal breath sounds.   Abdominal:      General: There is no distension.      Palpations: Abdomen is soft.  Tenderness: There is no abdominal tenderness.   Musculoskeletal:      Cervical back: Neck supple.   Skin:     General: Skin is warm and dry.   Neurological:      General: No focal deficit present.      Mental Status: He is alert and oriented to person, place, and time.   Psychiatric:         Mood and Affect: Mood normal.         Behavior: Behavior normal.       No orders to display       Labs Reviewed   COVID-19 AND OTHER RESPIRATORY VIRAL PCR TESTING    Narrative:     The following orders were created for panel order COVID-19 and Other Respiratory Viral PCR Tests.  Procedure                               Abnormality         Status                     ---------                               -----------         ------                     SARS-CoV-2 and Influenza.Marland Kitchen.[[id number]]                                                   Please view results for these tests on the individual orders.   SARS-COV-2 AND INFLUENZA A/B RNA BY PCR (CAT3)       ED Course & MDM      41yo M with PMHx HIV (on Biktarvy, undetectable viral load) and postnasal drip presents to ED with 2-day history of sore throat, cough with clear phlegm, and SOB that acutely worsened this afternoon while riding the T. His symptoms improved  following administration of his inhaler, but he was still nervous so he presented to the ED. Upon presentation, vital signs stable and saturating well on RA (96%), lungs clear on physical exam, and patient does not clinically appear short of breath during interview. Symptoms not severe enough to warrant imaging with CXR. PERC score 0. Upon request he was offered a COVID test despite his negative at-home test yesterday. He is clinically stable and may therefore be discharged and we will follow up if test positive.                         Gretchen Short, MD  09/28/21 [phone]

## 2021-10-01 ENCOUNTER — Inpatient Hospital Stay
Admit: 2021-10-01 | Discharge: 2021-10-01 | Disposition: A | Discharge status: Home / Self Care | Payer: MEDICAID | Attending: Student in an Organized Health Care Education/Training Program

## 2021-10-01 ENCOUNTER — Emergency Department: Admit: 2021-10-01 | Payer: MEDICAID | Primary: Pediatrics

## 2021-10-01 DIAGNOSIS — R051 Acute cough: Secondary | ICD-10-CM

## 2021-10-01 LAB — COMPREHENSIVE METABOLIC PANEL
ALT: 15 U/L (ref 0–55)
AST: 32 U/L (ref 6–42)
Albumin: 4.2 g/dL (ref 3.2–5.0)
Alkaline phosphatase: 68 U/L (ref 30–130)
Anion Gap: 9 mmol/L (ref 3–14)
BUN: 14 mg/dL (ref 6–24)
Bilirubin, total: 0.4 mg/dL (ref 0.2–1.2)
CO2 (Bicarbonate): 23 mmol/L (ref 20–32)
Calcium: 9.1 mg/dL (ref 8.5–10.5)
Chloride: 106 mmol/L (ref 98–110)
Creatinine: 1.29 mg/dL (ref 0.55–1.30)
Glucose: 107 mg/dL (ref 70–139)
Potassium: 3.9 mmol/L (ref 3.6–5.2)
Protein, total: 7.1 g/dL (ref 6.0–8.4)
Sodium: 138 mmol/L (ref 135–146)
eGFRcr: 71 mL/min/{1.73_m2} (ref >=60)

## 2021-10-01 LAB — CBC WITH DIFFERENTIAL
Basophils %: 0.4 %
Basophils Absolute: 0.02 10*3/uL (ref 0.00–0.22)
Eosinophils %: 0.8 %
Eosinophils Absolute: 0.04 10*3/uL (ref 0.00–0.50)
Hematocrit: 37.2 % (ref 37.0–53.0)
Hemoglobin: 11.9 g/dL — ABNORMAL LOW (ref 13.0–17.5)
Immature Granulocytes %: 0.4 %
Immature Granulocytes Absolute: 0.02 10*3/uL (ref 0.00–0.10)
Lymphocyte %: 42.9 %
Lymphocytes Absolute: 2.26 10*3/uL (ref 0.70–4.00)
MCH: 28.7 pg (ref 26.0–34.0)
MCHC: 32 g/dL (ref 31.0–37.0)
MCV: 89.9 fL (ref 80.0–100.0)
MPV: 10.1 fL (ref 9.1–12.4)
Monocytes %: 13.1 %
Monocytes Absolute: 0.69 10*3/uL (ref 0.38–0.83)
NRBC %: 0 % (ref 0.0–0.0)
NRBC Absolute: 0 10*3/uL (ref 0.00–2.00)
Neutrophil %: 42.4 %
Neutrophils Absolute: 2.24 10*3/uL (ref 1.50–7.95)
Platelets: 211 10*3/uL (ref 150–400)
RBC: 4.14 M/uL — ABNORMAL LOW (ref 4.20–5.90)
RDW-CV: 12.3 % (ref 11.5–14.5)
RDW-SD: 41.1 fL (ref 35.0–51.0)
WBC: 5.3 10*3/uL (ref 4.0–11.0)

## 2021-10-01 LAB — LIGHT BLUE TOP

## 2021-10-01 LAB — RAPID SARS-COV-2: SARS-CoV-2 RNA PCR: NOT DETECTED

## 2021-10-01 MED ORDER — guaiFENesin (Robitussin) 100 mg/5 mL syrup
100 | ORAL | 0 refills | 6.50000 days | Status: AC | PRN
Start: 2021-10-01 — End: 2021-10-08

## 2021-10-01 NOTE — Discharge Instructions (Addendum)
You came to the emergency department with cough and shortness of breath. Your vital signs were normal and you did not have a fever. The lab work did not show signs of infection. Your COVID test was negative. Your chest x-ray was normal. You were prescribed a cough syrup to help with your symptoms. Try to reduce the amount you smoke as that can irritate the lungs further. Please return to the emergency department if your symptoms worsen, you have fever or chills, or difficulty breathing.

## 2021-10-01 NOTE — ED Triage Notes (Signed)
The patient reports worsening cough and subjective SOB.  Was recently evaluated here for this but believes problem is worse today.  He called EMS today.  The patient is awake, alert, moving all extremities with a patent airway and unlabored breathing.

## 2021-10-01 NOTE — ED Notes (Signed)
Pt DC by provider, refused DC papers.     Jackquline Berlin, RN  10/01/21 1424

## 2021-10-01 NOTE — ED Provider Notes (Addendum)
EMERGENCY DEPARTMENT ENCOUNTER    ATTENDING NOTE    Date of service: 10/01/2021 10:06 AM    Chief Complaint   Patient presents with   . Shortness of Breath       Nursing notes reviewed and agreed with.    Electronic Medical Records Reviewed: Yes  Medical Interpreter Used: No  History Reviewed and Discussed with Resident    HISTORY OF PRESENT ILLNESS:    [name] is a 41 y.o. male who  has a past medical history of Anxiety and HIV (human immunodeficiency virus infection) (CMS/HCC). who presents with SOB. Pt was seen 3 days ago for similar, had been ordered for covid test but eloped prior to receiving covid test. He reports that the SOB is associated with physical exertion. +productive cough. Denies CP. He returns for primary concern of pneumonia. He also endorses intermittent abdominal pain. He reports that he had 4 BM yesterday and his abdominal pain improved after each BM. Denies n/v, fever.     Review of Systems   Constitutional: Negative for chills and fever.   HENT: Negative for ear pain.    Eyes: Negative for photophobia.   Respiratory: Positive for sputum production and shortness of breath. Negative for cough.    Cardiovascular: Negative for chest pain and palpitations.   Gastrointestinal: Negative for abdominal pain, diarrhea, nausea and vomiting.   Genitourinary: Negative for dysuria and hematuria.   Musculoskeletal: Negative for back pain and myalgias.   Skin: Negative.    Neurological: Negative for dizziness, weakness and headaches.   Endo/Heme/Allergies: Negative.    Psychiatric/Behavioral: Negative.    All other systems reviewed and are negative.    As per HPI.    PAST MEDICAL HISTORY / PROBLEM LIST    Past Medical History:   Diagnosis Date   . Anxiety    . HIV (human immunodeficiency virus infection) (CMS/HCC)        There is no problem list on file for this patient.      PAST SURGICAL HISTORY    History reviewed. No pertinent surgical history.    MEDICATIONS     Previous Medications    BIKTARVY  50-200-25 MG TABLET    Take 1 tablet by mouth in the morning.    CETIRIZINE (ZYRTEC) 10 MG TABLET    Take 1 tablet by mouth in the morning.    FLUTICASONE (FLONASE) 50 MCG/ACTUATION NASAL SPRAY    1 spray in the morning.    SERTRALINE (ZOLOFT) 25 MG TABLET    Take 25 mg by mouth in the morning.    VENTOLIN HFA 90 MCG/ACTUATION INHALER    Inhale 2 puffs every 4 (four) hours if needed.        ALLERGIES    Allergies   Allergen Reactions   . Penicillins Unknown     Unknown, just knows he can't take it  Mother told him, he believes he may have had it without issue  Other reaction(s): Unknown  Unknown, just knows he can't take it  Mother told him, he believes he may have had it without issue         SOCIAL HISTORY    Social History     Tobacco Use   . Smoking status: Never   . Smokeless tobacco: Never   Substance Use Topics   . Alcohol use: Not on file     Social History     Substance and Sexual Activity   Drug Use Yes   . Types: Marijuana  FAMILY HISTORY    No family history on file.    PHYSICAL EXAM    Physical Exam  Vitals and nursing note reviewed.   Constitutional:       General: He is not in acute distress.     Appearance: He is well-developed.   HENT:      Head: Normocephalic and atraumatic.      Comments: No maxillary or frontal sinus TTP.   Eyes:      Conjunctiva/sclera: Conjunctivae normal.   Cardiovascular:      Rate and Rhythm: Normal rate and regular rhythm.      Heart sounds: No murmur heard.  Pulmonary:      Effort: Pulmonary effort is normal. No respiratory distress.      Breath sounds: Normal breath sounds.   Abdominal:      Palpations: Abdomen is soft.      Tenderness: There is no abdominal tenderness.   Musculoskeletal:         General: No swelling.      Cervical back: Neck supple.   Skin:     General: Skin is warm and dry.      Capillary Refill: Capillary refill takes less than 2 seconds.   Neurological:      Mental Status: He is alert.   Psychiatric:         Mood and Affect: Mood normal.          LABS    Labs Reviewed   CBC WITH DIFFERENTIAL - Abnormal       Result Value    WBC 5.3      RBC 4.14 (*)     Hemoglobin 11.9 (*)     Hematocrit 37.2      MCV 89.9      MCH 28.7      MCHC 32.0      RDW-CV 12.3      RDW-SD 41.1      Platelets 211      MPV 10.1      Neutrophil % 42.4      Lymphocyte % 42.9      Monocytes % 13.1      Eosinophils % 0.8      Basophils % 0.4      Immature Granulocytes % 0.4      NRBC % 0.0      Neutrophils Absolute 2.24      Lymphocytes Absolute 2.26      Monocytes Absolute 0.69      Eosinophils Absolute 0.04      Basophils Absolute 0.02      Immature Granulocytes Absolute 0.02      NRBC Absolute 0.00     RAPID SARS-COV-2 - Normal    SARS-CoV-2 RNA PCR Not Detected     COVID-19 AND OTHER RESPIRATORY VIRAL PCR TESTING    Narrative:     The following orders were created for panel order COVID-19 and Other Respiratory Viral PCR Tests.  Procedure                               Abnormality         Status                     ---------                               -----------         ------  Rapid SARS-CoV-2[[id number]]             Normal              Final result                 Please view results for these tests on the individual orders.   CBC W/DIFF    Narrative:     The following orders were created for panel order CBC and differential.  Procedure                               Abnormality         Status                     ---------                               -----------         ------                     CBC w/ Differential[[id number]]          Abnormal            Final result                 Please view results for these tests on the individual orders.   COMPREHENSIVE METABOLIC PANEL    Sodium 138      Potassium 3.9      Chloride 106      CO2 (Bicarbonate) 23      Anion Gap 9      BUN 14      Creatinine 1.29      eGFRcr 71      Glucose 107      Fasting? Unknown      Calcium 9.1      AST 32      ALT 15      Alkaline phosphatase 68      Protein, total 7.1       Albumin 4.2      Bilirubin, total 0.4          RADIOLOGY    X-rays contemporaneously visualized and interpreted by me.    No pneumonia visualized.     XR CHEST 2 VIEWS   Final Result   No active cardiopulmonary disease.            APPROVED BY STAFF RADIOLOGIST: Tollie Eth, MD 10/01/2021 1:41 PM EDT          PROCEDURES    Procedures       MEDICAL DECISION MAKING & ED COURSE    Pertinent labs and imaging studies reviewed.    41 y/o male pt presents for evaluation of SOB. Pt breathing comfortably and satting well on RA. Labs unremarkable other than mildly low Hgb that is similar to prior. COVID test was negative, CXR with no PNA on my or rads read. Pt reports intermittent abd discomfort but has no abd ttp on exam and with normal LFTs, defer any abd imaging at this time. Recommended to reduce smoking in order to not exasperate symptoms. Will dc with return precautions, script for guaifenesin, and f/u with PCP in a few days.     Medical Decision Making  Acute cough: acute illness or injury  Amount and/or Complexity of Data Reviewed  Labs:  ordered.  Radiology: ordered and independent interpretation performed.      Risk  OTC drugs.  Prescription drug management.          EXTERNAL RECORDS REVIEWED  Previous ED visit reviewed.     INDEPENDENT INTERPRETATIONS  Chest XR    TESTS/TREATMENT/HOSPITALIZATION CONSIDERED  Labs, rapid COVID    CHRONIC CONDITIONS  Anxiety, HIV    ACUTE CONDITIONS  SOB    Diagnoses as of 10/01/21 1439   Acute cough     CLINICAL IMPRESSION:    1. Acute cough        I have personally performed the services described in this documentation, as written by my scribe above in my presence. It is both accurate and complete when signed by me. I have performed any pertinent edits.      By signing my name below, I, Kathyrn Sheriff, attest that this documentation has been prepared under the direction and presence of Dr. Barnie Alderman.     Electronically signed: Kathyrn Sheriff, Scribe. 10/01/21 2:39 PM.    Consent to use a scribe  was obtained prior to the start of the encounter by clinical staff.           Rudean Curt, MD  10/06/21 1031

## 2021-10-01 NOTE — Unmapped (Signed)
HPI   Chief Complaint   Patient presents with   . Shortness of Breath       41 yo male with PMHx of HIV on Biktarvy (undectable viral load per pt), environmental allergies, pneumonia (12/22, not hospitalized) and anxiety who presents with productive cough and dyspnea x 5 days. Pt came to the ED on 7/10 with similar symptoms and had a normal lung exam at that time. He ended up leaving before a COVID test was done. Pt came in today with similar symptoms. He is having increased white phlegm that gets stuck in his throat, which he finds can make it difficult to breath. This does cause him anxiety which he acknowledges could also be contributing to his SOB. He also notes having lower abdominal pain and bloating with increased number of BMs a day. However recently he has been eating more fried and fatty foods, which is atypical for him. He denies N/V/D, blood in stool or urine, hemoptysis, sinus pain or pressure, chest or pleuritic pain, weakness, fatigue, and urinary changes. He does smoke marijuana daily, but does not use tobacco products. He does not have any recent sick contacts.                    Glasgow Coma Scale Score: 15                                  Patient History   Past Medical History:   Diagnosis Date   . Anxiety    . HIV (human immunodeficiency virus infection) (CMS/HCC)      History reviewed. No pertinent surgical history.  No family history on file.  Social History     Tobacco Use   . Smoking status: Never   . Smokeless tobacco: Never   Substance Use Topics   . Alcohol use: Not on file   . Drug use: Yes     Types: Marijuana       Review of Systems   Review of Systems   Constitutional: Negative for chills, fatigue, fever and unexpected weight change.   HENT: Negative for sinus pressure, sinus pain and sore throat.    Respiratory: Positive for cough and shortness of breath.    Cardiovascular: Negative for chest pain.   Gastrointestinal: Positive for abdominal pain. Negative for anal bleeding, blood in  stool, constipation, diarrhea, nausea and vomiting.   Genitourinary: Negative for difficulty urinating, dysuria, frequency and hematuria.   Musculoskeletal: Positive for back pain. Negative for myalgias.   Allergic/Immunologic: Positive for environmental allergies.   Neurological: Negative for weakness and headaches.   Psychiatric/Behavioral: The patient is nervous/anxious.        Physical Exam   ED Triage Vitals [10/01/21 1002]   Temp Pulse Resp BP   36.4 C (97.6 F) 90 14 102/65      SpO2 Temp Source Heart Rate Source Patient Position   95 % Oral -- --      BP Location FiO2 (%)     -- --       Physical Exam  HENT:      Head: Normocephalic and atraumatic.   Eyes:      Extraocular Movements: Extraocular movements intact.      Pupils: Pupils are equal, round, and reactive to light.   Cardiovascular:      Rate and Rhythm: Normal rate and regular rhythm.      Pulses: Normal pulses.  Heart sounds: Normal heart sounds.   Pulmonary:      Effort: Pulmonary effort is normal. No accessory muscle usage or respiratory distress.      Breath sounds: Examination of the right-middle field reveals rhonchi. Examination of the right-lower field reveals rhonchi. Rhonchi present.   Abdominal:      Palpations: Abdomen is soft.      Tenderness: There is abdominal tenderness in the right lower quadrant, suprapubic area and left lower quadrant.   Skin:     General: Skin is warm and dry.   Neurological:      Mental Status: He is alert and oriented to person, place, and time.   Psychiatric:         Mood and Affect: Mood is anxious.       No orders to display       Labs Reviewed   CBC WITH DIFFERENTIAL - Abnormal       Result Value    WBC 5.3      RBC 4.14 (*)     Hemoglobin 11.9 (*)     Hematocrit 37.2      MCV 89.9      MCH 28.7      MCHC 32.0      RDW-CV 12.3      RDW-SD 41.1      Platelets 211      MPV 10.1      Neutrophil % 42.4      Lymphocyte % 42.9      Monocytes % 13.1      Eosinophils % 0.8      Basophils % 0.4      Immature  Granulocytes % 0.4      NRBC % 0.0      Neutrophils Absolute 2.24      Lymphocytes Absolute 2.26      Monocytes Absolute 0.69      Eosinophils Absolute 0.04      Basophils Absolute 0.02      Immature Granulocytes Absolute 0.02      NRBC Absolute 0.00     RAPID SARS-COV-2 - Normal    SARS-CoV-2 RNA PCR Not Detected     COVID-19 AND OTHER RESPIRATORY VIRAL PCR TESTING    Narrative:     The following orders were created for panel order COVID-19 and Other Respiratory Viral PCR Tests.  Procedure                               Abnormality         Status                     ---------                               -----------         ------                     Rapid SARS-CoV-2[[id number]]             Normal              Final result                 Please view results for these tests on the individual orders.   CBC W/DIFF    Narrative:     The following orders were created  for panel order CBC and differential.  Procedure                               Abnormality         Status                     ---------                               -----------         ------                     CBC w/ Differential[[id number]]          Abnormal            Final result                 Please view results for these tests on the individual orders.   COMPREHENSIVE METABOLIC PANEL    Sodium 138      Potassium 3.9      Chloride 106      CO2 (Bicarbonate) 23      Anion Gap 9      BUN 14      Creatinine 1.29      eGFRcr 71      Glucose 107      Fasting? Unknown      Calcium 9.1      AST 32      ALT 15      Alkaline phosphatase 68      Protein, total 7.1      Albumin 4.2      Bilirubin, total 0.4         ED Course & MDM   ED Course as of 10/01/21 1535   Thu Oct 01, 2021   1147 Hemoglobin(!): 11.9   1147 CBC and differential(!)         Diagnoses as of 10/01/21 1535   Acute cough       Medical Decision Making  Pt presents with productive cough, subjective dyspnea, and lower abdominal tenderness. Afebrile. VS WNL. Doesn't appear to be in respiratory  distress. Pt does have rhonchi in right lower and middle lung fields. CBC and CMP unremarkable. COVID-negative. CXR is normal. Abdominal bloating likely d/t recent change in diet. Pt prescribed Robitussin for cough and discharged home.    Acute cough: acute illness or injury  Amount and/or Complexity of Data Reviewed  Labs: ordered. Decision-making details documented in ED Course.  Radiology: ordered.      Risk  OTC drugs.                Gilles Chiquito, DO  10/01/21 1540

## 2021-10-02 LAB — BLOOD BANK HOLD TUBE

## 2021-10-17 ENCOUNTER — Emergency Department: Admit: 2021-10-17 | Payer: MEDICAID | Primary: Pediatrics

## 2021-10-17 ENCOUNTER — Inpatient Hospital Stay
Admit: 2021-10-17 | Discharge: 2021-10-17 | Disposition: A | Discharge status: Home / Self Care | Payer: MEDICAID | Attending: Student in an Organized Health Care Education/Training Program

## 2021-10-17 DIAGNOSIS — R052 Subacute cough: Secondary | ICD-10-CM

## 2021-10-17 MED ORDER — benzonatate (Tessalon) 100 mg capsule
100 | ORAL_CAPSULE | Freq: Three times a day (TID) | ORAL | 0 refills | 10.00000 days | Status: AC
Start: 2021-10-17 — End: 2021-10-24
  Filled 2021-10-18: qty 21, 7d supply, fill #0

## 2021-10-17 NOTE — Discharge Instructions (Signed)
You were seen today for persistent cough.  You may have a postviral cough that may persist for several weeks. You had an x-ray that did not show any pneumonia.  Please continue to take your medications including taking your Mucinex and Claritin more regularly.  You can also add on Delsym as it sounds like this has helped you in the past.  You are also prescribed Tessalon Perles to help with your cough.  Please follow-up with your PCP in a few days.  Please also follow-up with pulmonology as needed.      Please return to the emergency department if you develop worsening symptoms or other symptoms concerning to you.

## 2021-10-17 NOTE — ED Provider Notes (Signed)
EMERGENCY DEPARTMENT ENCOUNTER    ATTENDING NOTE    Date of service: 10/17/2021 12:37 PM    Chief Complaint   Patient presents with   . Shortness of Breath       Nursing notes reviewed and agreed with.    Electronic Medical Records Reviewed: No  Medical Interpreter Used: No  History Reviewed and Discussed with None    HISTORY OF PRESENT ILLNESS:    41 year old male with a PMHx with HIV, anxiety, and panic disorder presents via EMS for evaluation of SOB and cough. Patient reports 3 weeks of coughing, congestion, rhinorrhea, no appetite, SOB with ambulation, back pain, infrequent BM, and mild chest tightness, though denies fevers. Denies CP. He was recently seen by his PCP who diagnosed him with bronchitis, and prescribed him an antibiotic course (sounds like azithromycin) without significant improvement. He states he was given an inhaler for his bronchitis but does not use it frequently, and his cough is not productive despite feeling chest congestion. On arrival, he was concerned about a low O2 saturation that he noted at home to 90-91%. Has still been going to the gym although has been more tired/short of breath with his workouts.       ROS  As per HPI.    PAST MEDICAL HISTORY / PROBLEM LIST    Past Medical History:   Diagnosis Date   . Anxiety    . HIV (human immunodeficiency virus infection) (CMS/HCC)        There is no problem list on file for this patient.      PAST SURGICAL HISTORY    History reviewed. No pertinent surgical history.    MEDICATIONS     Previous Medications    BIKTARVY 50-200-25 MG TABLET    Take 1 tablet by mouth in the morning.    CETIRIZINE (ZYRTEC) 10 MG TABLET    Take 1 tablet by mouth in the morning.    FLUTICASONE (FLONASE) 50 MCG/ACTUATION NASAL SPRAY    1 spray in the morning.    SERTRALINE (ZOLOFT) 25 MG TABLET    Take 25 mg by mouth in the morning.    VENTOLIN HFA 90 MCG/ACTUATION INHALER    Inhale 2 puffs every 4 (four) hours if needed.        ALLERGIES    Allergies   Allergen  Reactions   . Penicillins Unknown     Unknown, just knows he can't take it  Mother told him, he believes he may have had it without issue  Other reaction(s): Unknown  Unknown, just knows he can't take it  Mother told him, he believes he may have had it without issue         SOCIAL HISTORY    Social History     Tobacco Use   . Smoking status: Never     Passive exposure: Never   . Smokeless tobacco: Never   Substance Use Topics   . Alcohol use: Not on file     Social History     Substance and Sexual Activity   Drug Use Yes   . Types: Marijuana       FAMILY HISTORY    No family history on file.    PHYSICAL EXAM    Physical Exam  Vitals and nursing note reviewed.   Constitutional:       General: He is not in acute distress.     Appearance: He is well-developed.   HENT:  Head: Normocephalic and atraumatic.   Eyes:      Conjunctiva/sclera: Conjunctivae normal.   Cardiovascular:      Rate and Rhythm: Normal rate and regular rhythm.      Heart sounds: No murmur heard.  Pulmonary:      Effort: Pulmonary effort is normal. No respiratory distress.      Breath sounds: Normal breath sounds.   Abdominal:      Palpations: Abdomen is soft.      Tenderness: There is no abdominal tenderness.   Musculoskeletal:         General: No swelling.      Cervical back: Neck supple.   Skin:     General: Skin is warm and dry.      Capillary Refill: Capillary refill takes less than 2 seconds.   Neurological:      Mental Status: He is alert.   Psychiatric:         Mood and Affect: Mood normal.       LABS    Labs Reviewed - No data to display     RADIOLOGY    X-rays contemporaneously visualized and interpreted by me.    CXR: No pneumonia  XR CHEST 2 VIEWS   Final Result   No acute pulmonary abnormality.            DICTATED: Vita Erm, DO   IN ACCORDANCE WITH DEPARTMENT POLICY, TEACHING PHYSICIANS REVIEW ALL IMAGES, AND EDIT REPORTS AS REQUIRED. A REPORT IS NOT FINAL UNTIL APPROVED BY A STAFF RADIOLOGIST.      APPROVED BY STAFF  RADIOLOGIST: Bosie Clos, MD 10/17/2021 3:10 PM EDT          MEDICAL DECISION MAKING & ED COURSE    Pertinent labs and imaging studies reviewed.    Medical Decision Making  41 year old male presents for evaluation of persistent cough associated with SOB and back pain. CXR was not significant for acute pulmonary abnormality or pneumonia. Pt breathing comfortably and satting well on RA during course of ED stay. Ambulatory pulse oximetry was 95%+. Ultimately patient was recommended to take Mucinex and Claritin more regularly (as pt reports that he had previously seen pulmonology who recommended that he take claritin daily) and Delsym as needed (as pt reports this has helped his cough in the past) for management of his cough. He was also prescribed Tessalon Perles and given instructions to follow up with his PCP and/or a pulmonologist if sx persist. Was advised that this may be a postviral cough/postnasal drip and may persist for several weeks after his initial illness. He was discharged in stable condition with good return precautions. Pt left the department prior to receiving dc paperwork and prior to obtaining repeat/discharge VS but did appear to have a normal heart rate on auscultation/exam at time of my eval.      Subacute cough: acute illness or injury     Details: subacute  Amount and/or Complexity of Data Reviewed  Labs:      Details: considered labs - had normal labs 2 wk ago and pt reports persistent but not worsening of sx so will defer labs  Radiology: ordered and independent interpretation performed.     Details: X-ray visualized and interpreted by me.      Risk  OTC drugs.  Prescription drug management.            Diagnoses as of 10/17/21 1516   Subacute cough         CLINICAL IMPRESSION:  1. Subacute cough        I have personally performed the services described in this documentation, as written by my scribe above in my presence. It is both accurate and complete when signed by me. I have performed any  pertinent edits.      By signing my name below, I, Verneita Griffes, attest that this documentation has been prepared under the direction and presence of Dr. Barnie Alderman.     Electronically signed: Verneita Griffes, Scribe. 10/17/21 3:16 PM.    Consent to use a scribe was obtained prior to the start of the encounter by clinical staff.       Rudean Curt, MD  10/18/21 [phone]

## 2021-10-17 NOTE — ED Notes (Signed)
Patient ambulated out of department prior to receiving DC paperwork and DC vitals     Lutricia Horsfall, RN  10/17/21 1528

## 2021-10-17 NOTE — ED Notes (Signed)
Patient maintains sats >95% with ambulation. Patient pacing in room, reporting he feels anxious and would like to leave. No obvious increased WOB.      Lutricia Horsfall, RN  10/17/21 1455

## 2021-10-17 NOTE — ED Triage Notes (Addendum)
Presents to ER via EMS, pt coming from home with 3 weeks of SOB and cough. Reporting left sided pleuritic chest pain worse with inspiration. Diagnosed with bronchitis 3 days ago, has been on z pack without relief. No fevers. Negative orthos per EMS. Pt had 1 episode of diarrhea this AM, no abdominal pain. Patient requested EMS to place patient on oxygen, room air sats initially 95%, improved to 98% on supplemental.
